# Patient Record
Sex: Female | Born: 1945
Health system: Southern US, Community
[De-identification: ages and names within clinical notes are randomized; demographics above are authoritative.]

## PROBLEM LIST (undated history)

## (undated) DIAGNOSIS — N950 Postmenopausal bleeding: Secondary | ICD-10-CM

## (undated) DIAGNOSIS — D649 Anemia, unspecified: Secondary | ICD-10-CM

## (undated) DIAGNOSIS — E785 Hyperlipidemia, unspecified: Secondary | ICD-10-CM

## (undated) DIAGNOSIS — M1990 Unspecified osteoarthritis, unspecified site: Secondary | ICD-10-CM

## (undated) DIAGNOSIS — E559 Vitamin D deficiency, unspecified: Secondary | ICD-10-CM

## (undated) DIAGNOSIS — R42 Dizziness and giddiness: Secondary | ICD-10-CM

## (undated) HISTORY — PX: SHOULDER ARTHROSCOPY: SHX128

## (undated) HISTORY — PX: COLONOSCOPY: SHX174

---

## 2004-06-27 ENCOUNTER — Other Ambulatory Visit: Payer: Self-pay

## 2005-06-07 ENCOUNTER — Ambulatory Visit: Payer: Self-pay | Admitting: Obstetrics and Gynecology

## 2005-08-09 ENCOUNTER — Encounter: Payer: Self-pay | Admitting: Internal Medicine

## 2005-09-01 ENCOUNTER — Encounter: Payer: Self-pay | Admitting: Internal Medicine

## 2006-06-13 ENCOUNTER — Ambulatory Visit: Payer: Self-pay | Admitting: Obstetrics and Gynecology

## 2007-06-18 ENCOUNTER — Ambulatory Visit: Payer: Self-pay | Admitting: Obstetrics and Gynecology

## 2008-06-24 ENCOUNTER — Ambulatory Visit: Payer: Self-pay | Admitting: Obstetrics and Gynecology

## 2009-06-30 ENCOUNTER — Ambulatory Visit: Payer: Self-pay | Admitting: Obstetrics and Gynecology

## 2009-07-07 ENCOUNTER — Ambulatory Visit: Payer: Self-pay

## 2009-08-20 ENCOUNTER — Ambulatory Visit: Payer: Self-pay | Admitting: Unknown Physician Specialty

## 2010-07-05 ENCOUNTER — Ambulatory Visit: Payer: Self-pay | Admitting: Obstetrics and Gynecology

## 2011-08-16 ENCOUNTER — Ambulatory Visit: Payer: Self-pay | Admitting: Obstetrics and Gynecology

## 2012-08-20 ENCOUNTER — Ambulatory Visit: Payer: Self-pay | Admitting: Obstetrics and Gynecology

## 2013-08-21 ENCOUNTER — Ambulatory Visit: Payer: Self-pay | Admitting: Obstetrics and Gynecology

## 2015-12-09 ENCOUNTER — Other Ambulatory Visit: Payer: Self-pay | Admitting: Internal Medicine

## 2015-12-09 DIAGNOSIS — E559 Vitamin D deficiency, unspecified: Secondary | ICD-10-CM | POA: Diagnosis not present

## 2015-12-09 DIAGNOSIS — M8580 Other specified disorders of bone density and structure, unspecified site: Secondary | ICD-10-CM | POA: Diagnosis not present

## 2015-12-09 DIAGNOSIS — N951 Menopausal and female climacteric states: Secondary | ICD-10-CM | POA: Diagnosis not present

## 2015-12-09 DIAGNOSIS — R55 Syncope and collapse: Secondary | ICD-10-CM | POA: Diagnosis not present

## 2015-12-09 DIAGNOSIS — Z1239 Encounter for other screening for malignant neoplasm of breast: Secondary | ICD-10-CM | POA: Diagnosis not present

## 2015-12-09 DIAGNOSIS — Z1231 Encounter for screening mammogram for malignant neoplasm of breast: Secondary | ICD-10-CM

## 2015-12-09 DIAGNOSIS — M533 Sacrococcygeal disorders, not elsewhere classified: Secondary | ICD-10-CM | POA: Diagnosis not present

## 2015-12-09 DIAGNOSIS — E784 Other hyperlipidemia: Secondary | ICD-10-CM | POA: Diagnosis not present

## 2015-12-09 DIAGNOSIS — Z1211 Encounter for screening for malignant neoplasm of colon: Secondary | ICD-10-CM | POA: Diagnosis not present

## 2015-12-09 DIAGNOSIS — Z1159 Encounter for screening for other viral diseases: Secondary | ICD-10-CM | POA: Diagnosis not present

## 2015-12-17 ENCOUNTER — Ambulatory Visit
Admission: RE | Admit: 2015-12-17 | Discharge: 2015-12-17 | Disposition: A | Discharge status: Home / Self Care | Payer: PPO | Source: Ambulatory Visit | Attending: Internal Medicine | Admitting: Internal Medicine

## 2015-12-17 DIAGNOSIS — R55 Syncope and collapse: Secondary | ICD-10-CM | POA: Diagnosis not present

## 2015-12-17 DIAGNOSIS — Z1231 Encounter for screening mammogram for malignant neoplasm of breast: Secondary | ICD-10-CM | POA: Diagnosis not present

## 2015-12-20 DIAGNOSIS — R55 Syncope and collapse: Secondary | ICD-10-CM | POA: Diagnosis not present

## 2015-12-20 DIAGNOSIS — R42 Dizziness and giddiness: Secondary | ICD-10-CM | POA: Diagnosis not present

## 2016-01-03 DIAGNOSIS — Z1159 Encounter for screening for other viral diseases: Secondary | ICD-10-CM | POA: Diagnosis not present

## 2016-01-10 DIAGNOSIS — E559 Vitamin D deficiency, unspecified: Secondary | ICD-10-CM | POA: Diagnosis not present

## 2016-01-10 DIAGNOSIS — E784 Other hyperlipidemia: Secondary | ICD-10-CM | POA: Diagnosis not present

## 2016-01-10 DIAGNOSIS — M533 Sacrococcygeal disorders, not elsewhere classified: Secondary | ICD-10-CM | POA: Diagnosis not present

## 2016-01-10 DIAGNOSIS — R55 Syncope and collapse: Secondary | ICD-10-CM | POA: Diagnosis not present

## 2016-01-10 DIAGNOSIS — Z23 Encounter for immunization: Secondary | ICD-10-CM | POA: Diagnosis not present

## 2016-01-12 DIAGNOSIS — N951 Menopausal and female climacteric states: Secondary | ICD-10-CM | POA: Diagnosis not present

## 2016-03-14 DIAGNOSIS — Z79890 Hormone replacement therapy (postmenopausal): Secondary | ICD-10-CM | POA: Diagnosis not present

## 2016-03-14 DIAGNOSIS — R55 Syncope and collapse: Secondary | ICD-10-CM | POA: Diagnosis not present

## 2016-03-21 DIAGNOSIS — L988 Other specified disorders of the skin and subcutaneous tissue: Secondary | ICD-10-CM | POA: Diagnosis not present

## 2016-03-21 DIAGNOSIS — L919 Hypertrophic disorder of the skin, unspecified: Secondary | ICD-10-CM | POA: Diagnosis not present

## 2016-03-21 DIAGNOSIS — N649 Disorder of breast, unspecified: Secondary | ICD-10-CM | POA: Diagnosis not present

## 2016-10-09 DIAGNOSIS — J019 Acute sinusitis, unspecified: Secondary | ICD-10-CM | POA: Diagnosis not present

## 2016-12-04 DIAGNOSIS — R55 Syncope and collapse: Secondary | ICD-10-CM | POA: Diagnosis not present

## 2016-12-04 DIAGNOSIS — E559 Vitamin D deficiency, unspecified: Secondary | ICD-10-CM | POA: Diagnosis not present

## 2016-12-04 DIAGNOSIS — E784 Other hyperlipidemia: Secondary | ICD-10-CM | POA: Diagnosis not present

## 2016-12-04 DIAGNOSIS — M533 Sacrococcygeal disorders, not elsewhere classified: Secondary | ICD-10-CM | POA: Diagnosis not present

## 2017-01-15 ENCOUNTER — Other Ambulatory Visit: Payer: Self-pay | Admitting: Obstetrics and Gynecology

## 2017-01-15 DIAGNOSIS — Z124 Encounter for screening for malignant neoplasm of cervix: Secondary | ICD-10-CM | POA: Diagnosis not present

## 2017-01-15 DIAGNOSIS — N950 Postmenopausal bleeding: Secondary | ICD-10-CM | POA: Diagnosis not present

## 2017-01-15 DIAGNOSIS — Z1231 Encounter for screening mammogram for malignant neoplasm of breast: Secondary | ICD-10-CM

## 2017-01-15 DIAGNOSIS — Z01419 Encounter for gynecological examination (general) (routine) without abnormal findings: Secondary | ICD-10-CM | POA: Diagnosis not present

## 2017-01-26 DIAGNOSIS — N950 Postmenopausal bleeding: Secondary | ICD-10-CM | POA: Diagnosis not present

## 2017-02-08 ENCOUNTER — Ambulatory Visit
Admission: RE | Admit: 2017-02-08 | Discharge: 2017-02-08 | Disposition: A | Discharge status: Home / Self Care | Payer: PPO | Source: Ambulatory Visit | Attending: Obstetrics and Gynecology | Admitting: Obstetrics and Gynecology

## 2017-02-08 DIAGNOSIS — Z1231 Encounter for screening mammogram for malignant neoplasm of breast: Secondary | ICD-10-CM

## 2017-03-01 DIAGNOSIS — E559 Vitamin D deficiency, unspecified: Secondary | ICD-10-CM | POA: Diagnosis not present

## 2017-03-01 DIAGNOSIS — Z0000 Encounter for general adult medical examination without abnormal findings: Secondary | ICD-10-CM | POA: Diagnosis not present

## 2017-03-01 DIAGNOSIS — Z79890 Hormone replacement therapy (postmenopausal): Secondary | ICD-10-CM | POA: Diagnosis not present

## 2017-03-01 DIAGNOSIS — E784 Other hyperlipidemia: Secondary | ICD-10-CM | POA: Diagnosis not present

## 2017-03-01 DIAGNOSIS — M8589 Other specified disorders of bone density and structure, multiple sites: Secondary | ICD-10-CM | POA: Diagnosis not present

## 2018-01-17 DIAGNOSIS — Z124 Encounter for screening for malignant neoplasm of cervix: Secondary | ICD-10-CM | POA: Diagnosis not present

## 2018-01-17 DIAGNOSIS — N393 Stress incontinence (female) (male): Secondary | ICD-10-CM | POA: Diagnosis not present

## 2018-01-17 DIAGNOSIS — Z1231 Encounter for screening mammogram for malignant neoplasm of breast: Secondary | ICD-10-CM | POA: Diagnosis not present

## 2018-01-21 ENCOUNTER — Other Ambulatory Visit: Payer: Self-pay | Admitting: Obstetrics and Gynecology

## 2018-01-21 DIAGNOSIS — Z1231 Encounter for screening mammogram for malignant neoplasm of breast: Secondary | ICD-10-CM

## 2018-02-14 ENCOUNTER — Ambulatory Visit
Admission: RE | Admit: 2018-02-14 | Discharge: 2018-02-14 | Disposition: A | Discharge status: Home / Self Care | Payer: PPO | Source: Ambulatory Visit | Attending: Obstetrics and Gynecology | Admitting: Obstetrics and Gynecology

## 2018-02-14 DIAGNOSIS — Z1231 Encounter for screening mammogram for malignant neoplasm of breast: Secondary | ICD-10-CM | POA: Diagnosis not present

## 2018-02-26 DIAGNOSIS — E559 Vitamin D deficiency, unspecified: Secondary | ICD-10-CM | POA: Diagnosis not present

## 2018-02-26 DIAGNOSIS — Z79890 Hormone replacement therapy (postmenopausal): Secondary | ICD-10-CM | POA: Diagnosis not present

## 2018-02-26 DIAGNOSIS — E7849 Other hyperlipidemia: Secondary | ICD-10-CM | POA: Diagnosis not present

## 2018-03-04 DIAGNOSIS — M8589 Other specified disorders of bone density and structure, multiple sites: Secondary | ICD-10-CM | POA: Diagnosis not present

## 2018-03-04 DIAGNOSIS — Z780 Asymptomatic menopausal state: Secondary | ICD-10-CM | POA: Diagnosis not present

## 2018-03-04 DIAGNOSIS — D72829 Elevated white blood cell count, unspecified: Secondary | ICD-10-CM | POA: Diagnosis not present

## 2018-03-04 DIAGNOSIS — E7849 Other hyperlipidemia: Secondary | ICD-10-CM | POA: Diagnosis not present

## 2018-03-04 DIAGNOSIS — E559 Vitamin D deficiency, unspecified: Secondary | ICD-10-CM | POA: Diagnosis not present

## 2018-03-04 DIAGNOSIS — M533 Sacrococcygeal disorders, not elsewhere classified: Secondary | ICD-10-CM | POA: Diagnosis not present

## 2018-03-04 DIAGNOSIS — Z0000 Encounter for general adult medical examination without abnormal findings: Secondary | ICD-10-CM | POA: Diagnosis not present

## 2018-03-15 DIAGNOSIS — Z780 Asymptomatic menopausal state: Secondary | ICD-10-CM | POA: Diagnosis not present

## 2018-03-15 DIAGNOSIS — E559 Vitamin D deficiency, unspecified: Secondary | ICD-10-CM | POA: Diagnosis not present

## 2018-08-12 DIAGNOSIS — M7062 Trochanteric bursitis, left hip: Secondary | ICD-10-CM | POA: Diagnosis not present

## 2018-08-12 DIAGNOSIS — M4306 Spondylolysis, lumbar region: Secondary | ICD-10-CM | POA: Diagnosis not present

## 2019-08-07 ENCOUNTER — Other Ambulatory Visit: Payer: Self-pay | Admitting: Obstetrics and Gynecology

## 2019-08-07 DIAGNOSIS — Z01419 Encounter for gynecological examination (general) (routine) without abnormal findings: Secondary | ICD-10-CM | POA: Diagnosis not present

## 2019-08-07 DIAGNOSIS — N950 Postmenopausal bleeding: Secondary | ICD-10-CM | POA: Diagnosis not present

## 2019-08-07 DIAGNOSIS — Z1231 Encounter for screening mammogram for malignant neoplasm of breast: Secondary | ICD-10-CM | POA: Diagnosis not present

## 2019-08-07 DIAGNOSIS — Z124 Encounter for screening for malignant neoplasm of cervix: Secondary | ICD-10-CM | POA: Diagnosis not present

## 2019-09-04 DIAGNOSIS — N950 Postmenopausal bleeding: Secondary | ICD-10-CM | POA: Diagnosis not present

## 2019-09-04 DIAGNOSIS — D259 Leiomyoma of uterus, unspecified: Secondary | ICD-10-CM | POA: Diagnosis not present

## 2019-11-19 DIAGNOSIS — N882 Stricture and stenosis of cervix uteri: Secondary | ICD-10-CM | POA: Diagnosis not present

## 2019-11-19 DIAGNOSIS — N950 Postmenopausal bleeding: Secondary | ICD-10-CM | POA: Diagnosis not present

## 2019-11-19 DIAGNOSIS — D259 Leiomyoma of uterus, unspecified: Secondary | ICD-10-CM | POA: Diagnosis not present

## 2020-01-19 ENCOUNTER — Ambulatory Visit
Admission: RE | Admit: 2020-01-19 | Discharge: 2020-01-19 | Disposition: A | Discharge status: Home / Self Care | Payer: PPO | Source: Ambulatory Visit | Attending: Obstetrics and Gynecology | Admitting: Obstetrics and Gynecology

## 2020-01-19 DIAGNOSIS — Z1231 Encounter for screening mammogram for malignant neoplasm of breast: Secondary | ICD-10-CM | POA: Diagnosis not present

## 2020-04-07 DIAGNOSIS — R768 Other specified abnormal immunological findings in serum: Secondary | ICD-10-CM | POA: Diagnosis not present

## 2020-04-07 DIAGNOSIS — E7849 Other hyperlipidemia: Secondary | ICD-10-CM | POA: Diagnosis not present

## 2020-04-07 DIAGNOSIS — E559 Vitamin D deficiency, unspecified: Secondary | ICD-10-CM | POA: Diagnosis not present

## 2020-04-08 DIAGNOSIS — R768 Other specified abnormal immunological findings in serum: Secondary | ICD-10-CM | POA: Diagnosis not present

## 2020-04-08 DIAGNOSIS — E7849 Other hyperlipidemia: Secondary | ICD-10-CM | POA: Diagnosis not present

## 2020-04-12 DIAGNOSIS — Z1211 Encounter for screening for malignant neoplasm of colon: Secondary | ICD-10-CM | POA: Diagnosis not present

## 2020-04-12 DIAGNOSIS — R3129 Other microscopic hematuria: Secondary | ICD-10-CM | POA: Diagnosis not present

## 2020-04-12 DIAGNOSIS — Z0000 Encounter for general adult medical examination without abnormal findings: Secondary | ICD-10-CM | POA: Diagnosis not present

## 2020-04-12 DIAGNOSIS — E7849 Other hyperlipidemia: Secondary | ICD-10-CM | POA: Diagnosis not present

## 2020-04-12 DIAGNOSIS — E559 Vitamin D deficiency, unspecified: Secondary | ICD-10-CM | POA: Diagnosis not present

## 2020-06-19 IMAGING — MG DIGITAL SCREENING BILAT W/ TOMO W/ CAD
6 of 10 series · 6 of 30 positions shown · non-contrast
Comparison: Previous exam(s).

CLINICAL DATA: Screening.

EXAM:
DIGITAL SCREENING BILATERAL MAMMOGRAM WITH TOMO AND CAD

[L MLO synth-2D (1 of 2)]
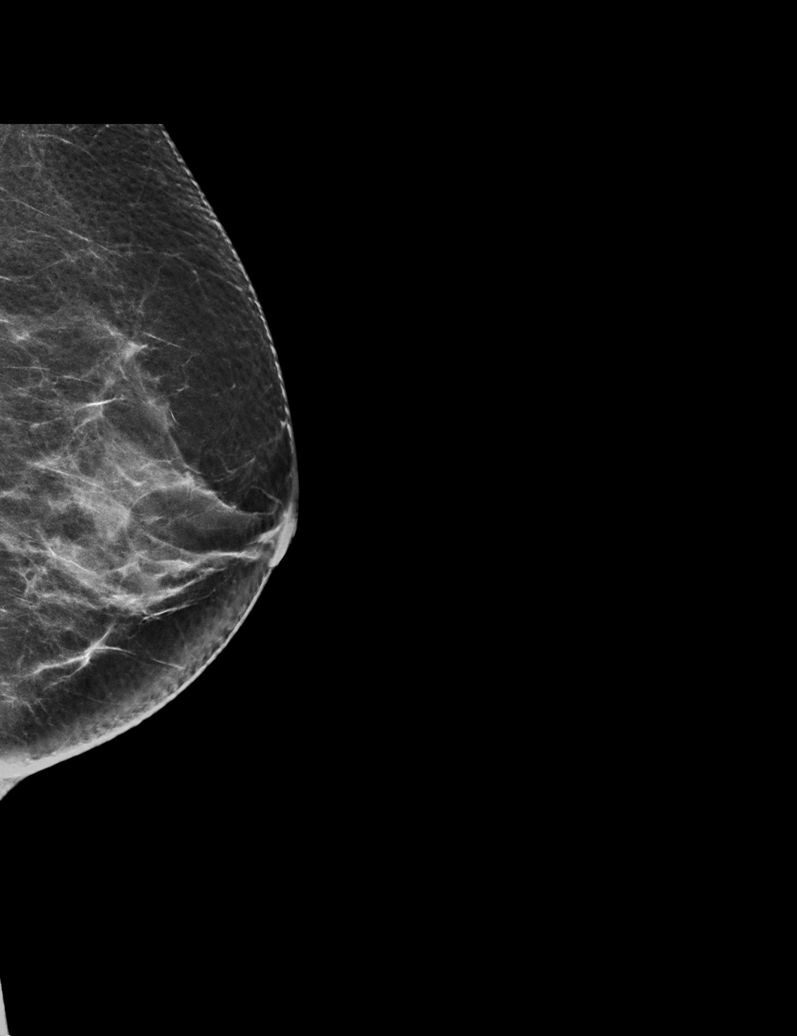

[L CC synth-2D]
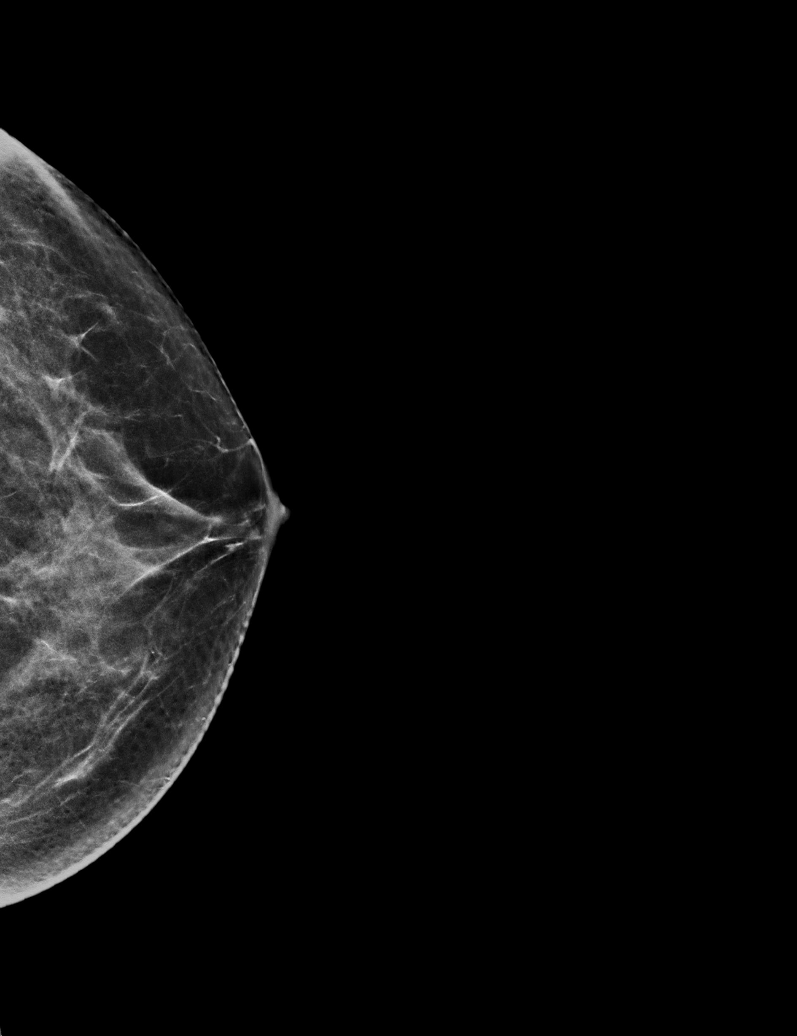

[L MLO synth-2D (2 of 2)]
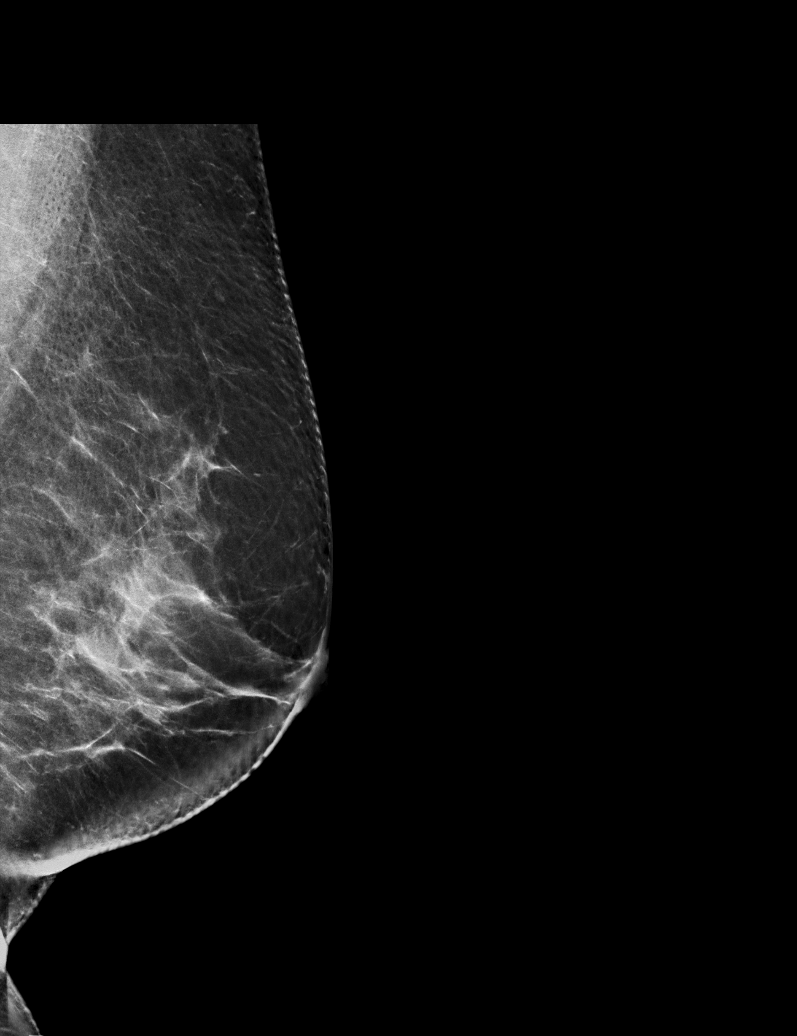

[R MLO synth-2D]
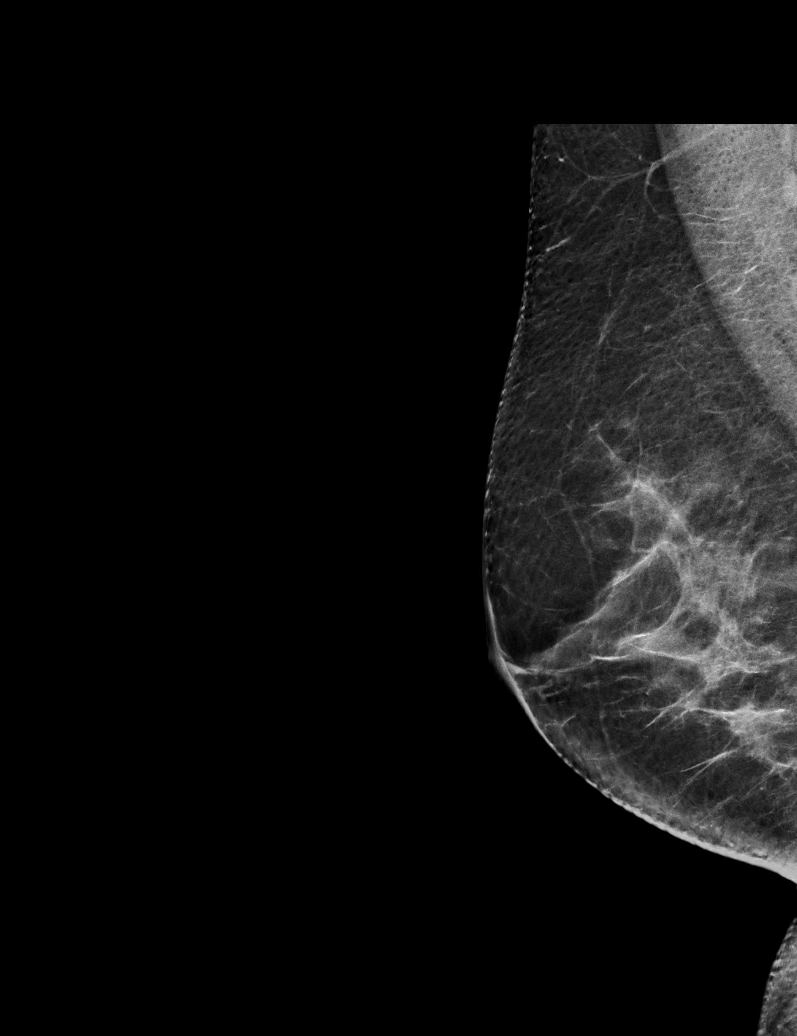

[R CC synth-2D]
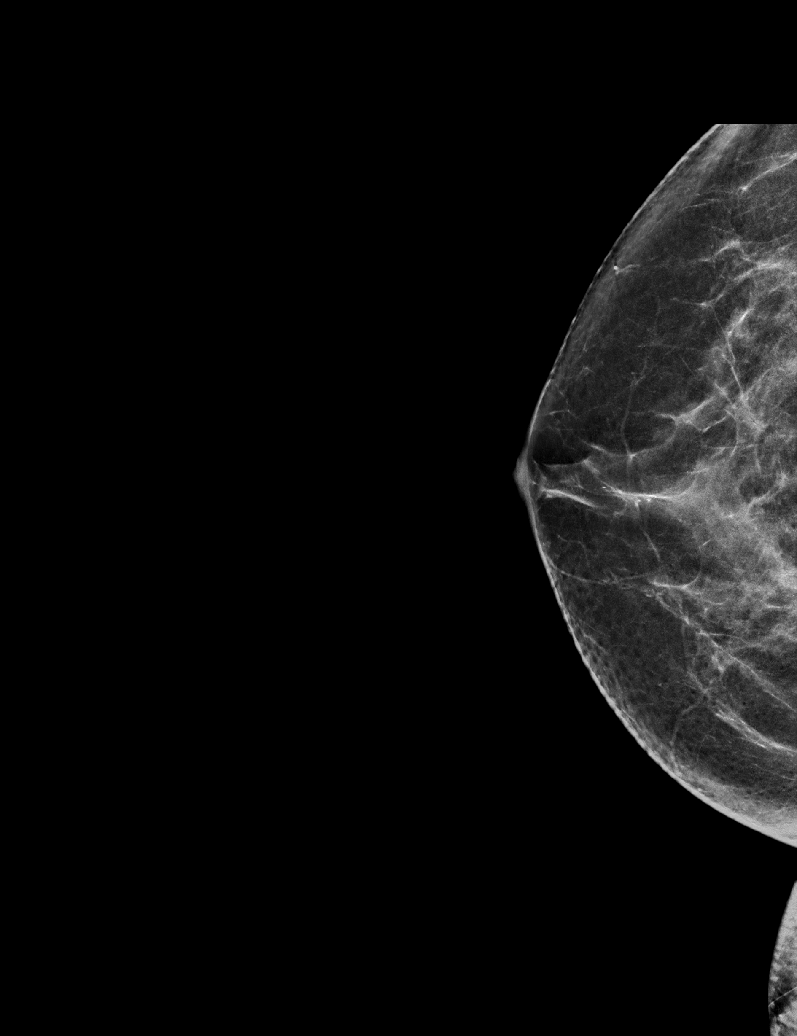

[R MLO tomo · tomo slice 33/66.0]
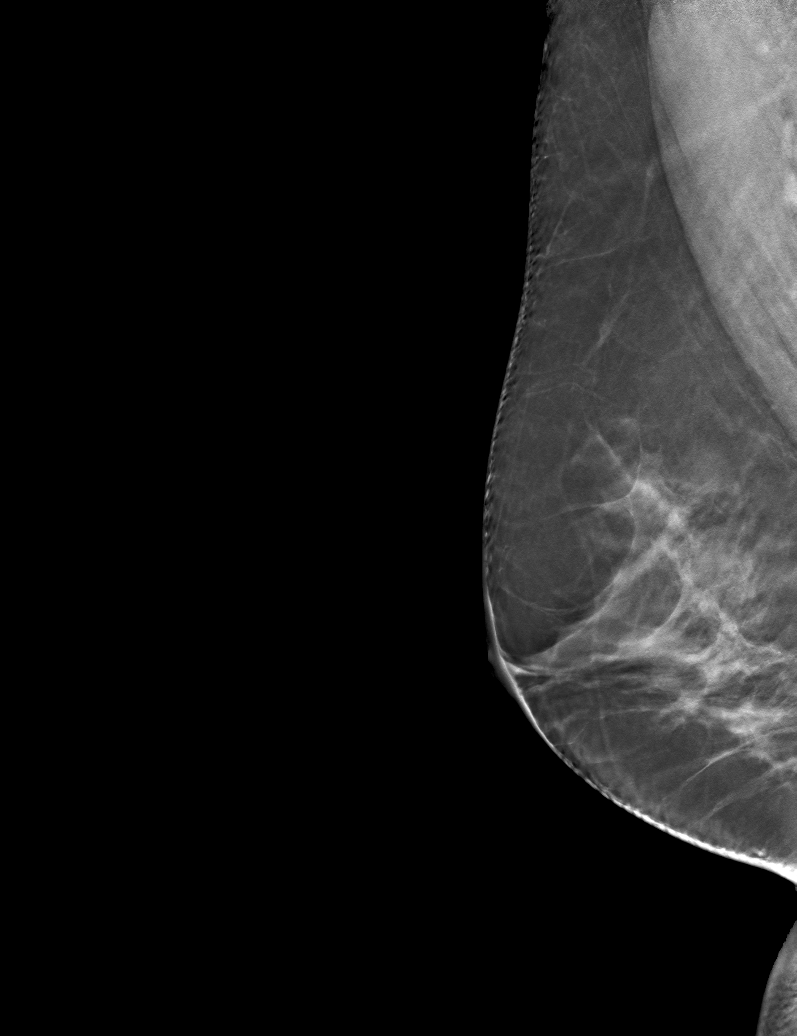

[6 of 30 positions shown; findings below may reference images not displayed]

ACR Breast Density Category c: The breast tissue is heterogeneously
dense, which may obscure small masses.
FINDINGS: There are no findings suspicious for malignancy. Images were
processed with CAD.
IMPRESSION: No mammographic evidence of malignancy. A result letter of this
screening mammogram will be mailed directly to the patient.

RECOMMENDATION:
Screening mammogram in one year. (Code:[id number])

BI-RADS CATEGORY  1: Negative.

## 2020-06-23 DIAGNOSIS — Z1211 Encounter for screening for malignant neoplasm of colon: Secondary | ICD-10-CM | POA: Diagnosis not present

## 2020-06-23 DIAGNOSIS — R768 Other specified abnormal immunological findings in serum: Secondary | ICD-10-CM | POA: Diagnosis not present

## 2020-06-23 DIAGNOSIS — Z01812 Encounter for preprocedural laboratory examination: Secondary | ICD-10-CM | POA: Diagnosis not present

## 2020-07-13 DIAGNOSIS — E7849 Other hyperlipidemia: Secondary | ICD-10-CM | POA: Diagnosis not present

## 2020-07-13 DIAGNOSIS — E559 Vitamin D deficiency, unspecified: Secondary | ICD-10-CM | POA: Diagnosis not present

## 2020-07-30 DIAGNOSIS — Z01818 Encounter for other preprocedural examination: Secondary | ICD-10-CM | POA: Diagnosis not present

## 2020-08-03 DIAGNOSIS — Z1211 Encounter for screening for malignant neoplasm of colon: Secondary | ICD-10-CM | POA: Diagnosis not present

## 2020-08-03 DIAGNOSIS — Z86010 Personal history of colonic polyps: Secondary | ICD-10-CM | POA: Diagnosis not present

## 2020-08-03 DIAGNOSIS — K6389 Other specified diseases of intestine: Secondary | ICD-10-CM | POA: Diagnosis not present

## 2020-08-03 DIAGNOSIS — K635 Polyp of colon: Secondary | ICD-10-CM | POA: Diagnosis not present

## 2020-08-03 DIAGNOSIS — K640 First degree hemorrhoids: Secondary | ICD-10-CM | POA: Diagnosis not present

## 2021-04-06 DIAGNOSIS — E559 Vitamin D deficiency, unspecified: Secondary | ICD-10-CM | POA: Diagnosis not present

## 2021-04-06 DIAGNOSIS — S80262A Insect bite (nonvenomous), left knee, initial encounter: Secondary | ICD-10-CM | POA: Diagnosis not present

## 2021-04-06 DIAGNOSIS — S80261A Insect bite (nonvenomous), right knee, initial encounter: Secondary | ICD-10-CM | POA: Diagnosis not present

## 2021-04-06 DIAGNOSIS — S70362A Insect bite (nonvenomous), left thigh, initial encounter: Secondary | ICD-10-CM | POA: Diagnosis not present

## 2021-04-06 DIAGNOSIS — R3129 Other microscopic hematuria: Secondary | ICD-10-CM | POA: Diagnosis not present

## 2021-04-06 DIAGNOSIS — E7849 Other hyperlipidemia: Secondary | ICD-10-CM | POA: Diagnosis not present

## 2021-04-06 DIAGNOSIS — S70361A Insect bite (nonvenomous), right thigh, initial encounter: Secondary | ICD-10-CM | POA: Diagnosis not present

## 2021-04-13 ENCOUNTER — Other Ambulatory Visit: Payer: Self-pay | Admitting: Internal Medicine

## 2021-04-13 ENCOUNTER — Other Ambulatory Visit: Payer: Self-pay | Admitting: Obstetrics and Gynecology

## 2021-04-13 DIAGNOSIS — Z1231 Encounter for screening mammogram for malignant neoplasm of breast: Secondary | ICD-10-CM

## 2021-04-13 DIAGNOSIS — Z780 Asymptomatic menopausal state: Secondary | ICD-10-CM | POA: Diagnosis not present

## 2021-04-13 DIAGNOSIS — D72829 Elevated white blood cell count, unspecified: Secondary | ICD-10-CM | POA: Diagnosis not present

## 2021-04-13 DIAGNOSIS — E039 Hypothyroidism, unspecified: Secondary | ICD-10-CM | POA: Diagnosis not present

## 2021-04-13 DIAGNOSIS — Z0000 Encounter for general adult medical examination without abnormal findings: Secondary | ICD-10-CM | POA: Diagnosis not present

## 2021-04-13 DIAGNOSIS — E559 Vitamin D deficiency, unspecified: Secondary | ICD-10-CM | POA: Diagnosis not present

## 2021-04-13 DIAGNOSIS — R42 Dizziness and giddiness: Secondary | ICD-10-CM | POA: Diagnosis not present

## 2021-04-13 DIAGNOSIS — E7849 Other hyperlipidemia: Secondary | ICD-10-CM | POA: Diagnosis not present

## 2021-04-19 DIAGNOSIS — M8588 Other specified disorders of bone density and structure, other site: Secondary | ICD-10-CM | POA: Diagnosis not present

## 2021-04-22 ENCOUNTER — Ambulatory Visit
Admission: RE | Admit: 2021-04-22 | Discharge: 2021-04-22 | Disposition: A | Discharge status: Home / Self Care | Payer: PPO | Source: Ambulatory Visit | Attending: Internal Medicine | Admitting: Internal Medicine

## 2021-04-22 ENCOUNTER — Other Ambulatory Visit: Payer: Self-pay

## 2021-04-22 DIAGNOSIS — Z1231 Encounter for screening mammogram for malignant neoplasm of breast: Secondary | ICD-10-CM

## 2021-05-24 DIAGNOSIS — D72829 Elevated white blood cell count, unspecified: Secondary | ICD-10-CM | POA: Diagnosis not present

## 2021-05-24 DIAGNOSIS — E039 Hypothyroidism, unspecified: Secondary | ICD-10-CM | POA: Diagnosis not present

## 2022-01-04 DIAGNOSIS — H35363 Drusen (degenerative) of macula, bilateral: Secondary | ICD-10-CM | POA: Diagnosis not present

## 2022-04-25 DIAGNOSIS — E7849 Other hyperlipidemia: Secondary | ICD-10-CM | POA: Diagnosis not present

## 2022-04-25 DIAGNOSIS — Z0000 Encounter for general adult medical examination without abnormal findings: Secondary | ICD-10-CM | POA: Diagnosis not present

## 2022-04-25 DIAGNOSIS — D72829 Elevated white blood cell count, unspecified: Secondary | ICD-10-CM | POA: Diagnosis not present

## 2022-04-25 DIAGNOSIS — E559 Vitamin D deficiency, unspecified: Secondary | ICD-10-CM | POA: Diagnosis not present

## 2022-05-02 DIAGNOSIS — E559 Vitamin D deficiency, unspecified: Secondary | ICD-10-CM | POA: Diagnosis not present

## 2022-05-02 DIAGNOSIS — E7849 Other hyperlipidemia: Secondary | ICD-10-CM | POA: Diagnosis not present

## 2022-05-02 DIAGNOSIS — Z0000 Encounter for general adult medical examination without abnormal findings: Secondary | ICD-10-CM | POA: Diagnosis not present

## 2022-05-02 DIAGNOSIS — N950 Postmenopausal bleeding: Secondary | ICD-10-CM | POA: Diagnosis not present

## 2022-05-25 DIAGNOSIS — N950 Postmenopausal bleeding: Secondary | ICD-10-CM | POA: Diagnosis not present

## 2022-05-29 DIAGNOSIS — N950 Postmenopausal bleeding: Secondary | ICD-10-CM | POA: Diagnosis not present

## 2022-05-29 DIAGNOSIS — Z124 Encounter for screening for malignant neoplasm of cervix: Secondary | ICD-10-CM | POA: Diagnosis not present

## 2022-05-29 DIAGNOSIS — Z1331 Encounter for screening for depression: Secondary | ICD-10-CM | POA: Diagnosis not present

## 2022-05-29 DIAGNOSIS — Z01419 Encounter for gynecological examination (general) (routine) without abnormal findings: Secondary | ICD-10-CM | POA: Diagnosis not present

## 2022-05-29 DIAGNOSIS — Z1231 Encounter for screening mammogram for malignant neoplasm of breast: Secondary | ICD-10-CM | POA: Diagnosis not present

## 2022-05-30 ENCOUNTER — Other Ambulatory Visit: Payer: Self-pay | Admitting: Internal Medicine

## 2022-05-30 ENCOUNTER — Other Ambulatory Visit: Payer: Self-pay | Admitting: Obstetrics and Gynecology

## 2022-05-30 DIAGNOSIS — Z1231 Encounter for screening mammogram for malignant neoplasm of breast: Secondary | ICD-10-CM

## 2022-06-01 ENCOUNTER — Ambulatory Visit
Admission: RE | Admit: 2022-06-01 | Discharge: 2022-06-01 | Disposition: A | Discharge status: Home / Self Care | Payer: PPO | Source: Ambulatory Visit | Attending: Obstetrics and Gynecology | Admitting: Obstetrics and Gynecology

## 2022-06-01 DIAGNOSIS — Z1231 Encounter for screening mammogram for malignant neoplasm of breast: Secondary | ICD-10-CM | POA: Insufficient documentation

## 2022-06-27 DIAGNOSIS — N950 Postmenopausal bleeding: Secondary | ICD-10-CM | POA: Diagnosis not present

## 2022-06-27 DIAGNOSIS — N882 Stricture and stenosis of cervix uteri: Secondary | ICD-10-CM | POA: Diagnosis not present

## 2022-07-12 DIAGNOSIS — N950 Postmenopausal bleeding: Secondary | ICD-10-CM | POA: Diagnosis not present

## 2022-07-18 NOTE — H&P (Signed)
Chief Complaint:   Kimberly Kim is a 76 y.o. female here for Pre Op Consulting (Sign consents) . History of Present Illness: Patient presents for a preoperative visit to schedule a D&C, hysteroscopy, and myomectomy.    Workup has included:   TVUS 05/2022 for PMB Uterus=8.58 x 5.41 x 8.50cm Uterus retroverted Endometrium=4.66m Hypoechoic complex area seen in cervix=3.34 x 1.46 x 2.07cm Rt ovary appears wnl Left ovary appears wnl No free fluid seen Fibroids seen:1)Lt lateral cervix=4.4cm     2)posterior=1.8cm    3)Lt mid=1.9cm    4)Lt lateral anterior=1.8cm    5)Lt lateral=1.6cm   SIS 06/2022 for PMB - Uterus: retroverted 9.02 x 4.92 x 5.43 cm  - Endometrium: 4.72 mm  - Hypoechoic complex area in cervix 3.31 x 1.48 x 2.09 cm  - Ovaries: RO wnl, LO 0.88 cm cyst    EMBx 06/2022  NO ENDOMETRIAL TISSUE IDENTIFIED. MINUTE FRAGMENTS OF BENIGN ENDOCERVICAL GLANDS, MUCUS, AND BLOOD.    Pertinent hx:   -On HRT, restarted at age [age]for life-limiting vasomotor sx; will readdress each year---> "Would rather take a bullet to the head than stop my estrogen."   -PMB 2017; ES=3.761m spotting for about 3 days very occasionally for the past few years, now more continuous  -PMB 2021, endosee with bx done 11/2019. Plan for repeat bx and endosee in 6 months. Pt didn't return  -Stress urinary incontinence; she chose to start kegels at home for the time being   Past Medical History:  has a past medical history of Bleeding disorder (CMS-HCC) (03/02/2022), Hyperlipidemia (12/08/2015), Osteopenia (12/08/2015), Sacroiliac pain (12/08/2015), and Vitamin D deficiency (12/08/2015).  Past Surgical History:  has a past surgical history that includes Arthroscopic Rotator Cuff Repair (Right) and Colonoscopy (08/03/2020). Family History: family history includes Osteoporosis (Thinning of bones) in her mother. Social History:  reports that she has never smoked. She has never used smokeless tobacco. She reports  that she does not currently use alcohol. She reports that she does not use drugs. OB/GYN History:   OB History        Gravida 2   Para 2   Term 2   Preterm     AB     Living 2        SAB     IAB     Ectopic     Molar     Multiple     Live Births             Allergies: has No Known Allergies. Medications:   Current Outpatient Medications:    cholecalciferol (VITAMIN D3) 2,000 unit tablet, Take 2 Units by mouth once daily, Disp: , Rfl:    estradioL (ESTRACE) 2 MG tablet, Take 1 tablet by mouth once daily, Disp: 90 tablet, Rfl: 0   medroxyPROGESTERone (PROVERA) 10 MG tablet, Take 1/2 (one-half) tablet by mouth once daily, Disp: 45 tablet, Rfl: 0   loratadine (CLARITIN ORAL), Take by mouth As needed (Patient not taking: Reported on 05/29/2022), Disp: , Rfl:    Review of Systems: No SOB, no palpitations or chest pain, no new lower extremity edema, no nausea or vomiting or bowel or bladder complaints. See HPI for gyn specific ROS.     Exam:   BP 130/72   Pulse 74   Ht 157.5 cm ('5\' 2"'$ )   Wt 62.8 kg (138 lb 6.4 oz)   BMI 25.31 kg/m    General: Patient is well-groomed, well-nourished, appears stated age in no  acute distress   HEENT: head is atraumatic and normocephalic, trachea is midline, neck is supple with no palpable nodules   CV: Regular rhythm and normal heart rate, no murmur   Pulm: Clear to auscultation throughout lung fields with no wheezing, crackles, or rhonchi. No increased work of breathing   Abdomen: soft , no mass, non-tender, no rebound tenderness, no hepatomegaly   Pelvic: tanner stage 5 ,              External genitalia: vulva /labia no lesions             Urethra: no prolapse             Vagina: normal physiologic d/c, laxity in vaginal walls             Cervix: no lesions, no cervical motion tenderness, good descent             Uterus: normal size shape and contour, non-tender             Adnexa: no mass,  non-tender                Rectovaginal: External wnl   A female chaperone was present for the more sensitive portions of the physical exam ( such as breast and pelvic)    Impression:   The encounter diagnosis was Post-menopausal bleeding.    Plan:   1.  Preoperative visit: D&C hysteroscopy with myomectomy. Consents signed today.  -Risks of surgery were discussed with the patient including but not limited to: bleeding which may require transfusion; infection which may require antibiotics; injury to uterus or surrounding organs; intrauterine scarring which may impair future fertility; need for additional procedures including laparotomy or laparoscopy; and other postoperative/anesthesia complications. Written informed consent was obtained.   This is a scheduled same-day surgery. She will have a postop visit in 2 weeks to review operative findings and pathology.   Return for Postop check.

## 2022-07-19 ENCOUNTER — Encounter
Admission: RE | Admit: 2022-07-19 | Discharge: 2022-07-19 | Disposition: A | Discharge status: Home / Self Care | Payer: PPO | Source: Ambulatory Visit | Attending: Obstetrics and Gynecology | Admitting: Obstetrics and Gynecology

## 2022-07-19 DIAGNOSIS — Z01818 Encounter for other preprocedural examination: Secondary | ICD-10-CM

## 2022-07-19 DIAGNOSIS — E785 Hyperlipidemia, unspecified: Secondary | ICD-10-CM

## 2022-07-19 HISTORY — DX: Vitamin D deficiency, unspecified: E55.9

## 2022-07-19 HISTORY — DX: Anemia, unspecified: D64.9

## 2022-07-19 HISTORY — DX: Hyperlipidemia, unspecified: E78.5

## 2022-07-19 NOTE — Patient Instructions (Signed)
Your procedure is scheduled on:07-27-22 Thursday Report to the Registration Desk on the 1st floor of the [facility].Then proceed to the 2nd floor Surgery Desk To find out your arrival time, please call [phone] between 1PM - 3PM on:07-26-22 Wednesday If your arrival time is 6:00 am, do not arrive prior to that time as the [facility] entrance doors do not open until 6:00 am.  REMEMBER: Instructions that are not followed completely may result in serious medical risk, up to and including death; or upon the discretion of your surgeon and anesthesiologist your surgery may need to be rescheduled.  Do not eat food after midnight the night before surgery.  No gum chewing, lozengers or hard candies.  You may however, drink CLEAR liquids up to 2 hours before you are scheduled to arrive for your surgery. Do not drink anything within 2 hours of your scheduled arrival time.  Clear liquids include: - water  - apple juice without pulp - gatorade (not RED colors) - black coffee or tea (Do NOT add milk or creamers to the coffee or tea) Do NOT drink anything that is not on this list.  Do NOT take any medication the day of surgery  One week prior to surgery: Stop Anti-inflammatories (NSAIDS) such as Advil, Aleve, Ibuprofen, Motrin, Naproxen, Naprosyn and Aspirin based products such as Excedrin, Goodys Powder, BC Powder.You may however, continue to take Tylenol if needed for pain up until the day of surgery.  Stop ANY OVER THE COUNTER supplements/vitamins NOW (07-19-22) until after surgery (Vitamin D3)  No Alcohol for 24 hours before or after surgery.  No Smoking including e-cigarettes for 24 hours prior to surgery.  No chewable tobacco products for at least 6 hours prior to surgery.  No nicotine patches on the day of surgery.  Do not use any "recreational" drugs for at least a week prior to your surgery.  Please be advised that the combination of cocaine and anesthesia may have negative  outcomes, up to and including death. If you test positive for cocaine, your surgery will be cancelled.  On the morning of surgery brush your teeth with toothpaste and water, you may rinse your mouth with mouthwash if you wish. Do not swallow any toothpaste or mouthwash.  Do not wear jewelry, make-up, hairpins, clips or nail polish.  Do not wear lotions, powders, or perfumes.   Do not shave body from the neck down 48 hours prior to surgery just in case you cut yourself which could leave a site for infection.  Also, freshly shaved skin may become irritated if using the CHG soap.  Contact lenses, hearing aids and dentures may not be worn into surgery.  Do not bring valuables to the hospital. [facility] is not responsible for any missing/lost belongings or valuables.   Notify your doctor if there is any change in your medical condition (cold, fever, infection).  Wear comfortable clothing (specific to your surgery type) to the hospital.  After surgery, you can help prevent lung complications by doing breathing exercises.  Take deep breaths and cough every 1-2 hours. Your doctor may order a device called an Incentive Spirometer to help you take deep breaths. When coughing or sneezing, hold a pillow firmly against your incision with both hands. This is called "splinting." Doing this helps protect your incision. It also decreases belly discomfort.  If you are being admitted to the hospital overnight, leave your suitcase in the car. After surgery it may be brought to your room.  If you are being discharged the day of surgery, you will not be allowed to drive home. You will need a responsible adult (18 years or older) to drive you home and stay with you that night.   If you are taking public transportation, you will need to have a responsible adult (18 years or older) with you. Please confirm with your physician that it is acceptable to use public transportation.   Please call the  [facility] at [phone] if you have any questions about these instructions.  Surgery Visitation Policy:  Patients undergoing a surgery or procedure may have two family members or support persons with them as long as the person is not COVID-19 positive or experiencing its symptoms.     How to Use an Incentive Spirometer An incentive spirometer is a tool that measures how well you are filling your lungs with each breath. Learning to take long, deep breaths using this tool can help you keep your lungs clear and active. This may help to reverse or lessen your chance of developing breathing (pulmonary) problems, especially infection. You may be asked to use a spirometer: After a surgery. If you have a lung problem or a history of smoking. After a long period of time when you have been unable to move or be active. If the spirometer includes an indicator to show the highest number that you have reached, your health care provider or respiratory therapist will help you set a goal. Keep a log of your progress as told by your health care provider. What are the risks? Breathing too quickly may cause dizziness or cause you to pass out. Take your time so you do not get dizzy or light-headed. If you are in pain, you may need to take pain medicine before doing incentive spirometry. It is harder to take a deep breath if you are having pain. How to use your incentive spirometer  Sit up on the edge of your bed or on a chair. Hold the incentive spirometer so that it is in an upright position. Before you use the spirometer, breathe out normally. Place the mouthpiece in your mouth. Make sure your lips are closed tightly around it. Breathe in slowly and as deeply as you can through your mouth, causing the piston or the ball to rise toward the top of the chamber. Hold your breath for 3-5 seconds, or for as long as possible. If the spirometer includes a coach indicator, use this to guide you  in breathing. Slow down your breathing if the indicator goes above the marked areas. Remove the mouthpiece from your mouth and breathe out normally. The piston or ball will return to the bottom of the chamber. Rest for a few seconds, then repeat the steps 10 or more times. Take your time and take a few normal breaths between deep breaths so that you do not get dizzy or light-headed. Do this every 1-2 hours when you are awake. If the spirometer includes a goal marker to show the highest number you have reached (best effort), use this as a goal to work toward during each repetition. After each set of 10 deep breaths, cough a few times. This will help to make sure that your lungs are clear. If you have an incision on your chest or abdomen from surgery, place a pillow or a rolled-up towel firmly against the incision when you cough. This can help to reduce pain while taking deep breaths and coughing. General tips When you are  able to get out of bed: Walk around often. Continue to take deep breaths and cough in order to clear your lungs. Keep using the incentive spirometer until your health care provider says it is okay to stop using it. If you have been in the hospital, you may be told to keep using the spirometer at home. Contact a health care provider if: You are having difficulty using the spirometer. You have trouble using the spirometer as often as instructed. Your pain medicine is not giving enough relief for you to use the spirometer as told. You have a fever. Get help right away if: You develop shortness of breath. You develop a cough with bloody mucus from the lungs. You have fluid or blood coming from an incision site after you cough. Summary An incentive spirometer is a tool that can help you learn to take long, deep breaths to keep your lungs clear and active. You may be asked to use a spirometer after a surgery, if you have a lung problem or a history of smoking, or if you have been  inactive for a long period of time. Use your incentive spirometer as instructed every 1-2 hours while you are awake. If you have an incision on your chest or abdomen, place a pillow or a rolled-up towel firmly against your incision when you cough. This will help to reduce pain. Get help right away if you have shortness of breath, you cough up bloody mucus, or blood comes from your incision when you cough. This information is not intended to replace advice given to you by your health care provider. Make sure you discuss any questions you have with your health care provider. Document Revised: 12/08/2019 Document Reviewed: 12/08/2019 Elsevier Patient Education  [place].

## 2022-07-20 ENCOUNTER — Encounter
Admission: RE | Admit: 2022-07-20 | Discharge: 2022-07-20 | Disposition: A | Discharge status: Home / Self Care | Payer: PPO | Source: Ambulatory Visit | Attending: Obstetrics and Gynecology | Admitting: Obstetrics and Gynecology

## 2022-07-20 DIAGNOSIS — E785 Hyperlipidemia, unspecified: Secondary | ICD-10-CM

## 2022-07-20 DIAGNOSIS — N950 Postmenopausal bleeding: Secondary | ICD-10-CM | POA: Diagnosis not present

## 2022-07-20 DIAGNOSIS — Z01818 Encounter for other preprocedural examination: Secondary | ICD-10-CM

## 2022-07-20 DIAGNOSIS — Z01810 Encounter for preprocedural cardiovascular examination: Secondary | ICD-10-CM | POA: Diagnosis not present

## 2022-07-20 LAB — CBC
HCT: 38.8 % (ref 36.0–46.0)
Hemoglobin: 12.5 g/dL (ref 12.0–15.0)
MCH: 27.7 pg (ref 26.0–34.0)
MCHC: 32.2 g/dL (ref 30.0–36.0)
MCV: 86 fL (ref 80.0–100.0)
Platelets: 325 10*3/uL (ref 150–400)
RBC: 4.51 MIL/uL (ref 3.87–5.11)
RDW: 12.9 % (ref 11.5–15.5)
WBC: 10.1 10*3/uL (ref 4.0–10.5)
nRBC: 0 % (ref 0.0–0.2)

## 2022-07-20 LAB — TYPE AND SCREEN
ABO/RH(D): A POS
Antibody Screen: NEGATIVE

## 2022-07-20 LAB — BASIC METABOLIC PANEL
Anion gap: 7 (ref 5–15)
BUN: 18 mg/dL (ref 8–23)
CO2: 28 mmol/L (ref 22–32)
Calcium: 8.9 mg/dL (ref 8.9–10.3)
Chloride: 103 mmol/L (ref 98–111)
Creatinine, Ser: 0.72 mg/dL (ref 0.44–1.00)
GFR, Estimated: >60 mL/min (ref >60)
Glucose, Bld: 116 mg/dL — ABNORMAL HIGH (ref 70–99)
Potassium: 3.3 mmol/L — ABNORMAL LOW (ref 3.5–5.1)
Sodium: 138 mmol/L (ref 135–145)

## 2022-07-27 ENCOUNTER — Encounter: Payer: Self-pay | Admitting: Obstetrics and Gynecology

## 2022-07-27 ENCOUNTER — Ambulatory Visit: Payer: PPO | Admitting: Certified Registered Nurse Anesthetist

## 2022-07-27 ENCOUNTER — Other Ambulatory Visit: Payer: Self-pay

## 2022-07-27 ENCOUNTER — Encounter
Admission: RE | Disposition: A | Discharge status: Home / Self Care | Payer: Self-pay | Source: Home / Self Care | Attending: Obstetrics and Gynecology

## 2022-07-27 ENCOUNTER — Ambulatory Visit
Admission: RE | Admit: 2022-07-27 | Discharge: 2022-07-27 | Disposition: A | Discharge status: Home / Self Care | Payer: PPO | Attending: Obstetrics and Gynecology | Admitting: Obstetrics and Gynecology

## 2022-07-27 DIAGNOSIS — N858 Other specified noninflammatory disorders of uterus: Secondary | ICD-10-CM | POA: Diagnosis not present

## 2022-07-27 DIAGNOSIS — N950 Postmenopausal bleeding: Secondary | ICD-10-CM | POA: Insufficient documentation

## 2022-07-27 DIAGNOSIS — N882 Stricture and stenosis of cervix uteri: Secondary | ICD-10-CM | POA: Diagnosis not present

## 2022-07-27 DIAGNOSIS — D259 Leiomyoma of uterus, unspecified: Secondary | ICD-10-CM | POA: Diagnosis not present

## 2022-07-27 DIAGNOSIS — Z79890 Hormone replacement therapy (postmenopausal): Secondary | ICD-10-CM | POA: Insufficient documentation

## 2022-07-27 DIAGNOSIS — E7800 Pure hypercholesterolemia, unspecified: Secondary | ICD-10-CM | POA: Diagnosis not present

## 2022-07-27 HISTORY — DX: Postmenopausal bleeding: N95.0

## 2022-07-27 HISTORY — PX: HYSTEROSCOPY WITH D & C: SHX1775

## 2022-07-27 LAB — ABO/RH: ABO/RH(D): A POS

## 2022-07-27 SURGERY — DILATATION AND CURETTAGE /HYSTEROSCOPY
Anesthesia: General

## 2022-07-27 MED ORDER — EPHEDRINE SULFATE (PRESSORS) 50 MG/ML IJ SOLN
INTRAMUSCULAR | Status: DC | PRN
  Administered 2022-07-27: 5 mg via INTRAVENOUS

## 2022-07-27 MED ORDER — DEXAMETHASONE SODIUM PHOSPHATE 10 MG/ML IJ SOLN
INTRAMUSCULAR | Status: DC | PRN
  Administered 2022-07-27: 10 mg via INTRAVENOUS

## 2022-07-27 MED ORDER — LACTATED RINGERS IV SOLN: INTRAVENOUS | Status: DC

## 2022-07-27 MED ORDER — PROPOFOL 10 MG/ML IV BOLUS
INTRAVENOUS | Status: DC | PRN
  Administered 2022-07-27: 140 mg via INTRAVENOUS

## 2022-07-27 MED ORDER — CHLORHEXIDINE GLUCONATE 0.12 % MT SOLN
OROMUCOSAL | Status: AC
  Administered 2022-07-27: 15 mL via OROMUCOSAL
  Filled 2022-07-27: qty 15

## 2022-07-27 MED ORDER — OXYCODONE HCL 5 MG/5ML PO SOLN: 5.0000 mg | Freq: Once | ORAL | Status: DC | PRN

## 2022-07-27 MED ORDER — KETOROLAC TROMETHAMINE 30 MG/ML IJ SOLN
INTRAMUSCULAR | Status: DC | PRN
  Administered 2022-07-27: 15 mg via INTRAVENOUS

## 2022-07-27 MED ORDER — ORAL CARE MOUTH RINSE: 15.0000 mL | Freq: Once | OROMUCOSAL | Status: AC

## 2022-07-27 MED ORDER — PHENYLEPHRINE HCL-NACL 20-0.9 MG/250ML-% IV SOLN
INTRAVENOUS | Status: DC | PRN
  Administered 2022-07-27: 40 ug/min via INTRAVENOUS

## 2022-07-27 MED ORDER — ACETAMINOPHEN 10 MG/ML IV SOLN
INTRAVENOUS | Status: AC
  Filled 2022-07-27: qty 100

## 2022-07-27 MED ORDER — OXYCODONE HCL 5 MG PO TABS: 5.0000 mg | ORAL_TABLET | Freq: Once | ORAL | Status: DC | PRN

## 2022-07-27 MED ORDER — FENTANYL CITRATE (PF) 100 MCG/2ML IJ SOLN
INTRAMUSCULAR | Status: DC | PRN
  Administered 2022-07-27 (×4): 25 ug via INTRAVENOUS

## 2022-07-27 MED ORDER — LIDOCAINE HCL (CARDIAC) PF 100 MG/5ML IV SOSY
PREFILLED_SYRINGE | INTRAVENOUS | Status: DC | PRN
  Administered 2022-07-27: 50 mg via INTRAVENOUS

## 2022-07-27 MED ORDER — CHLORHEXIDINE GLUCONATE 0.12 % MT SOLN: 15.0000 mL | Freq: Once | OROMUCOSAL | Status: AC

## 2022-07-27 MED ORDER — POVIDONE-IODINE 10 % EX SWAB: 2.0000 | Freq: Once | CUTANEOUS | Status: DC

## 2022-07-27 MED ORDER — ACETAMINOPHEN 10 MG/ML IV SOLN
INTRAVENOUS | Status: DC | PRN
  Administered 2022-07-27: 1000 mg via INTRAVENOUS

## 2022-07-27 MED ORDER — FENTANYL CITRATE (PF) 100 MCG/2ML IJ SOLN
INTRAMUSCULAR | Status: AC
  Filled 2022-07-27: qty 2

## 2022-07-27 MED ORDER — FAMOTIDINE 20 MG PO TABS
ORAL_TABLET | ORAL | Status: AC
  Administered 2022-07-27: 20 mg via ORAL
  Filled 2022-07-27: qty 1

## 2022-07-27 MED ORDER — FAMOTIDINE 20 MG PO TABS: 20.0000 mg | ORAL_TABLET | Freq: Once | ORAL | Status: AC

## 2022-07-27 MED ORDER — LACTATED RINGERS IV SOLN: INTRAVENOUS | Status: DC | PRN

## 2022-07-27 MED ORDER — HYDROMORPHONE HCL 1 MG/ML IJ SOLN: 0.2500 mg | INTRAMUSCULAR | Status: DC | PRN

## 2022-07-27 MED ORDER — ONDANSETRON HCL 4 MG/2ML IJ SOLN
INTRAMUSCULAR | Status: DC | PRN
  Administered 2022-07-27: 4 mg via INTRAVENOUS

## 2022-07-27 SURGICAL SUPPLY — 21 items
BAG PRESSURE INF REUSE 1000 (BAG) ×1 IMPLANT
BASIN KIT SINGLE STR (MISCELLANEOUS) ×1 IMPLANT
DRSG TELFA 3X8 NADH STRL (GAUZE/BANDAGES/DRESSINGS) IMPLANT
ELECT REM PT RETURN 9FT ADLT (ELECTROSURGICAL)
ELECTRODE REM PT RTRN 9FT ADLT (ELECTROSURGICAL) ×1 IMPLANT
GLOVE BIO SURGEON STRL SZ7 (GLOVE) ×1 IMPLANT
GLOVE SURG UNDER LTX SZ7.5 (GLOVE) ×1 IMPLANT
GOWN STRL REUS W/ TWL LRG LVL3 (GOWN DISPOSABLE) ×2 IMPLANT
GOWN STRL REUS W/TWL LRG LVL3 (GOWN DISPOSABLE) ×2
IV NS IRRIG 3000ML ARTHROMATIC (IV SOLUTION) ×1 IMPLANT
KIT PROCEDURE FLUENT (KITS) ×1 IMPLANT
KIT TURNOVER CYSTO (KITS) ×1 IMPLANT
MANIFOLD NEPTUNE II (INSTRUMENTS) ×1 IMPLANT
PACK DNC HYST (MISCELLANEOUS) ×1 IMPLANT
PAD PREP 24X41 OB/GYN DISP (PERSONAL CARE ITEMS) ×1 IMPLANT
SCRUB CHG 4% DYNA-HEX 4OZ (MISCELLANEOUS) ×1 IMPLANT
SEAL ROD LENS SCOPE MYOSURE (ABLATOR) ×1 IMPLANT
SET CYSTO W/LG BORE CLAMP LF (SET/KITS/TRAYS/PACK) IMPLANT
TRAP FLUID SMOKE EVACUATOR (MISCELLANEOUS) ×1 IMPLANT
TUBING CONNECTING 10 (TUBING) ×1 IMPLANT
WATER STERILE IRR 500ML POUR (IV SOLUTION) ×1 IMPLANT

## 2022-07-27 NOTE — Interval H&P Note (Signed)
History and Physical Interval Note:  07/27/2022 7:47 AM  Kimberly Kim  has presented today for surgery, with the diagnosis of post menopausal bleeding, endometrial mass.  The various methods of treatment have been discussed with the patient and family. After consideration of risks, benefits and other options for treatment, the patient has consented to  Procedure(s): DILATATION AND CURETTAGE /HYSTEROSCOPY, MYOMECTOMY (N/A) as a surgical intervention.  The patient's history has been reviewed, patient examined, no change in status, stable for surgery.  I have reviewed the patient's chart and labs.  Questions were answered to the patient's satisfaction.     Benjaman Kindler

## 2022-07-27 NOTE — Op Note (Signed)
Operative Report Hysteroscopy with Dilation and Curettage   Indications: Postmenopausal bleeding   Pre-operative Diagnosis: Fibroid uterus   Post-operative Diagnosis: Cervical stenosis  Procedure: 1. Exam under anesthesia 2. Fractional D&C 3. Hysteroscopy  Surgeon: Benjaman Kindler, MD  Assistant(s):  None  Anesthesia: General LMA anesthesia  Anesthesiologist: Ilene Qua, MD Anesthesiologist: Ilene Qua, MD CRNA: Willette Alma, CRNA  Estimated Blood Loss:  Minimal         Intraoperative medications:  none         Total IV Fluids: 435m  Urine Output: 826m Total Fluid Deficit:  455 mL          Specimens: Endocervical curettings, endometrial curettings (though these may not have reached the uterus)         Complications:  none         Disposition: PACU - hemodynamically stable.         Condition: stable  Findings: Cervix measuring 4 cm by sound but the endometrial cavity was unable to be identified, even under Hydro scopic evaluation.  The cervix itself was stenotic, and required dilation to enter for the curette.  I progressed to centimeter at a time under direct visualization, but was unable to identify the internal cervical os\; normal cervix, vagina, perineum.   Indication for procedure/Consents: [age].o. F here for scheduled surgery for the aforementioned diagnoses.     Risks of surgery were discussed with the patient including but not limited to: bleeding which may require transfusion; infection which may require antibiotics; injury to uterus or surrounding organs; intrauterine scarring which may impair future fertility; need for additional procedures including laparotomy or laparoscopy; and other postoperative/anesthesia complications. Written informed consent was obtained.    Procedure Details:  Fractional D&C only  The patient was taken to the operating room where anesthesia was administered and was found to be adequate.  After a formal and  adequate timeout was performed, she was placed in the dorsal lithotomy position and examined with the above findings. She was then prepped and draped in the sterile manner.   Her bladder was catheterized for an estimated amount of clear, yellow urine. A weighed speculum was then placed in the patient's vagina and a single tooth tenaculum was applied to the anterior lip of the cervix.  Her cervix was serially dilated to 15 FrPakistansing Hanks dilators.  An ECC was performed. The hysteroscope was introduced to reveal the above findings.  After 4 attempts, of gentle dilation in the appropriate direction under direct visualization, I was unable to positively identify the intrauterine cavity definitively.  A gentle curette was taken of a possible cavity space, and the procedures terminated early for safety.  The tenaculum was removed from the anterior lip of the cervix and the vaginal speculum was removed after applying pressure for good hemostasis.   The patient tolerated the procedure well and was taken to the recovery area awake and in stable condition. She received iv acetaminophen and Toradol prior to leaving the OR.  The patient will be discharged to home as per PACU criteria. Routine postoperative instructions given.  She was prescribed Ibuprofen and Colace.  She will follow up in the clinic in two weeks for postoperative evaluation.  Since she had had postmenopausal bleeding in 2021, consideration for an MRI versus hysterectomy is recommended.

## 2022-07-27 NOTE — Transfer of Care (Signed)
Immediate Anesthesia Transfer of Care Note  Patient: Kimberly Kim  Procedure(s) Performed: DILATATION AND CURETTAGE /HYSTEROSCOPY  Patient Location: PACU  Anesthesia Type:General  Level of Consciousness: awake, alert  and oriented  Airway & Oxygen Therapy: Patient Spontanous Breathing and Patient connected to face mask oxygen  Post-op Assessment: Report given to RN and Post -op Vital signs reviewed and stable  Post vital signs: Reviewed and stable  Last Vitals:  Vitals Value Taken Time  BP 146/65 07/27/22 1021  Temp 36.4 C 07/27/22 1021  Pulse 71 07/27/22 1023  Resp 12 07/27/22 1023  SpO2 100 % 07/27/22 1023  Vitals shown include unvalidated device data.  Last Pain:  Vitals:   07/27/22 0743  TempSrc: Temporal  PainSc: 0-No pain         Complications: No notable events documented.

## 2022-07-27 NOTE — Anesthesia Procedure Notes (Signed)
Procedure Name: LMA Insertion Date/Time: 07/27/2022 9:27 AM  Performed by: Willette Alma, CRNAPre-anesthesia Checklist: Patient identified, Patient being monitored, Timeout performed, Emergency Drugs available and Suction available Patient Re-evaluated:Patient Re-evaluated prior to induction Oxygen Delivery Method: Circle system utilized Preoxygenation: Pre-oxygenation with 100% oxygen Induction Type: IV induction Ventilation: Mask ventilation without difficulty LMA: LMA inserted Tube type: Oral Tube size: 4.0 mm Number of attempts: 1 Placement Confirmation: positive ETCO2 and breath sounds checked- equal and bilateral Tube secured with: Tape Dental Injury: Teeth and Oropharynx as per pre-operative assessment

## 2022-07-27 NOTE — Anesthesia Preprocedure Evaluation (Addendum)
Anesthesia Evaluation  Patient identified by MRN, date of birth, ID band Patient awake    Reviewed: Allergy & Precautions, NPO status , Patient's Chart, lab work & pertinent test results  History of Anesthesia Complications Negative for: history of anesthetic complications  Airway Mallampati: III  TM Distance: >3 FB Neck ROM: full    Dental  (+) Chipped   Pulmonary neg pulmonary ROS,    Pulmonary exam normal        Cardiovascular Exercise Tolerance: Good Normal cardiovascular exam     Neuro/Psych negative neurological ROS  negative psych ROS   GI/Hepatic negative GI ROS, Neg liver ROS,   Endo/Other  negative endocrine ROS  Renal/GU      Musculoskeletal   Abdominal   Peds  Hematology negative hematology ROS (+)   Anesthesia Other Findings Past Medical History: No date: Anemia No date: Hyperlipidemia No date: Postmenopausal bleeding No date: Vitamin D deficiency  Past Surgical History: No date: COLONOSCOPY No date: SHOULDER ARTHROSCOPY; Right  BMI    Body Mass Index: 23.78 kg/m      Reproductive/Obstetrics negative OB ROS                            Anesthesia Physical Anesthesia Plan  ASA: 2  Anesthesia Plan: General LMA   Post-op Pain Management: Ofirmev IV (intra-op)* and Toradol IV (intra-op)*   Induction: Intravenous  PONV Risk Score and Plan: 4 or greater and Dexamethasone, Ondansetron, Midazolam and Treatment may vary due to age or medical condition  Airway Management Planned: LMA  Additional Equipment:   Intra-op Plan:   Post-operative Plan: Extubation in OR  Informed Consent: I have reviewed the patients History and Physical, chart, labs and discussed the procedure including the risks, benefits and alternatives for the proposed anesthesia with the patient or authorized representative who has indicated his/her understanding and acceptance.     Dental  Advisory Given  Plan Discussed with: Anesthesiologist, CRNA and Surgeon  Anesthesia Plan Comments: (Patient consented for risks of anesthesia including but not limited to:  - adverse reactions to medications - damage to eyes, teeth, lips or other oral mucosa - nerve damage due to positioning  - sore throat or hoarseness - Damage to heart, brain, nerves, lungs, other parts of body or loss of life  Patient voiced understanding.)       Anesthesia Quick Evaluation

## 2022-07-27 NOTE — Anesthesia Postprocedure Evaluation (Signed)
Anesthesia Post Note  Patient: Kimberly Kim  Procedure(s) Performed: DILATATION AND CURETTAGE /HYSTEROSCOPY  Patient location during evaluation: PACU Anesthesia Type: General Level of consciousness: awake and alert Pain management: pain level controlled Vital Signs Assessment: post-procedure vital signs reviewed and stable Respiratory status: spontaneous breathing, nonlabored ventilation, respiratory function stable and patient connected to nasal cannula oxygen Cardiovascular status: blood pressure returned to baseline and stable Postop Assessment: no apparent nausea or vomiting Anesthetic complications: no   No notable events documented.   Last Vitals:  Vitals:   07/27/22 1030 07/27/22 1045  BP: (!) 132/52 (!) 135/45  Pulse: 63 65  Resp: 16 18  Temp:    SpO2: 100% 98%    Last Pain:  Vitals:   07/27/22 1045  TempSrc:   PainSc: 0-No pain                 Ilene Qua

## 2022-07-27 NOTE — Discharge Instructions (Addendum)
Discharge instructions after a hysteroscopy with dilation and curettage  Signs and Symptoms to Report  Call our office at [phone] if you have any of the following:    Fever over 100.4 degrees or higher  Severe stomach pain not relieved with pain medications  Bright red bleeding that's heavier than a period that does not slow with rest after the first 24 hours  To go the bathroom a lot (frequency), you can't hold your urine (urgency), or it hurts when you empty your bladder (urinate)  Chest pain  Shortness of breath  Pain in the calves of your legs  Severe nausea and vomiting not relieved with anti-nausea medications  Any concerns  What You Can Expect after Surgery  You may see some pink tinged, bloody fluid. This is normal. You may also have cramping for several days.   Activities after Your Discharge Follow these guidelines to help speed your recovery at home:  Don't drive if you are in pain or taking narcotic pain medicine. You may drive when you can safely slam on the brakes, turn the wheel forcefully, and rotate your torso comfortably. This is typically 4-7 days. Practice in a parking lot or side street prior to attempting to drive regularly.   Ask others to help with household chores for 4 weeks.  Don't do strenuous activities, exercises, or sports like vacuuming, tennis, squash, etc. until your doctor says it is safe to do so.  Walk as you feel able. Rest often since it may take a week or two for your energy level to return to normal.   You may climb stairs  Avoid constipation:   -Eat fruits, vegetables, and whole grains. Eat small meals as your appetite will take time to return to normal.   -Drink 6 to 8 glasses of water each day unless your doctor has told you to limit your fluids.   -Use a laxative or stool softener as needed if constipation becomes a problem. You may take Miralax, metamucil, Citrucil, Colace, Senekot, FiberCon, etc. If this does not relieve the  constipation, try two tablespoons of Milk Of Magnesia every 8 hours until your bowels move.   You may shower.   Do not get in a hot tub, swimming pool, etc. until your doctor agrees.  Do not douche, use tampons, or have sex until your doctor says it is okay, usually about 2 weeks.  Take your pain medicine when you need it. The medicine may not work as well if the pain is bad.  Take the medicines you were taking before surgery. Other medications you might need are pain medications (ibuprofen), medications for constipation (Colace) and nausea medications (Zofran).      AMBULATORY SURGERY  DISCHARGE INSTRUCTIONS   The drugs that you were given will stay in your system until tomorrow so for the next 24 hours you should not:  Drive an automobile Make any legal decisions Drink any alcoholic beverage   You may resume regular meals tomorrow.  Today it is better to start with liquids and gradually work up to solid foods.  You may eat anything you prefer, but it is better to start with liquids, then soup and crackers, and gradually work up to solid foods.   Please notify your doctor immediately if you have any unusual bleeding, trouble breathing, redness and pain at the surgery site, drainage, fever, or pain not relieved by medication.    Additional Instructions:        Please contact your physician   with any problems or Same Day Surgery at [phone], Monday through Friday 6 am to 4 pm, or [facility] number at [phone]. 

## 2022-07-28 ENCOUNTER — Encounter: Payer: Self-pay | Admitting: Obstetrics and Gynecology

## 2022-07-28 LAB — SURGICAL PATHOLOGY

## 2022-12-22 DIAGNOSIS — H814 Vertigo of central origin: Secondary | ICD-10-CM | POA: Diagnosis not present

## 2022-12-22 DIAGNOSIS — E7849 Other hyperlipidemia: Secondary | ICD-10-CM | POA: Diagnosis not present

## 2022-12-22 DIAGNOSIS — R42 Dizziness and giddiness: Secondary | ICD-10-CM | POA: Diagnosis not present

## 2023-01-03 DIAGNOSIS — R42 Dizziness and giddiness: Secondary | ICD-10-CM | POA: Diagnosis not present

## 2023-02-23 DIAGNOSIS — M654 Radial styloid tenosynovitis [de Quervain]: Secondary | ICD-10-CM | POA: Diagnosis not present

## 2023-05-23 DIAGNOSIS — E7849 Other hyperlipidemia: Secondary | ICD-10-CM | POA: Diagnosis not present

## 2023-05-23 DIAGNOSIS — E559 Vitamin D deficiency, unspecified: Secondary | ICD-10-CM | POA: Diagnosis not present

## 2023-05-23 DIAGNOSIS — Z0000 Encounter for general adult medical examination without abnormal findings: Secondary | ICD-10-CM | POA: Diagnosis not present

## 2023-05-23 DIAGNOSIS — N950 Postmenopausal bleeding: Secondary | ICD-10-CM | POA: Diagnosis not present

## 2023-05-28 ENCOUNTER — Other Ambulatory Visit: Payer: Self-pay | Admitting: Internal Medicine

## 2023-05-28 DIAGNOSIS — Z1231 Encounter for screening mammogram for malignant neoplasm of breast: Secondary | ICD-10-CM

## 2023-05-30 DIAGNOSIS — E7849 Other hyperlipidemia: Secondary | ICD-10-CM | POA: Diagnosis not present

## 2023-05-30 DIAGNOSIS — M8589 Other specified disorders of bone density and structure, multiple sites: Secondary | ICD-10-CM | POA: Diagnosis not present

## 2023-05-30 DIAGNOSIS — Z0000 Encounter for general adult medical examination without abnormal findings: Secondary | ICD-10-CM | POA: Diagnosis not present

## 2023-05-30 DIAGNOSIS — E559 Vitamin D deficiency, unspecified: Secondary | ICD-10-CM | POA: Diagnosis not present

## 2023-05-31 ENCOUNTER — Other Ambulatory Visit: Payer: Self-pay | Admitting: Internal Medicine

## 2023-05-31 DIAGNOSIS — E7849 Other hyperlipidemia: Secondary | ICD-10-CM

## 2023-06-07 ENCOUNTER — Ambulatory Visit
Admission: RE | Admit: 2023-06-07 | Discharge: 2023-06-07 | Disposition: A | Discharge status: Home / Self Care | Payer: PPO | Source: Ambulatory Visit | Attending: Internal Medicine | Admitting: Internal Medicine

## 2023-06-07 ENCOUNTER — Ambulatory Visit
Admission: RE | Admit: 2023-06-07 | Discharge: 2023-06-07 | Disposition: A | Discharge status: Home / Self Care | Payer: Self-pay | Source: Ambulatory Visit | Attending: Internal Medicine | Admitting: Internal Medicine

## 2023-06-07 DIAGNOSIS — E7849 Other hyperlipidemia: Secondary | ICD-10-CM | POA: Insufficient documentation

## 2023-06-07 DIAGNOSIS — Z1231 Encounter for screening mammogram for malignant neoplasm of breast: Secondary | ICD-10-CM

## 2023-08-17 DIAGNOSIS — M533 Sacrococcygeal disorders, not elsewhere classified: Secondary | ICD-10-CM | POA: Diagnosis not present

## 2023-08-17 DIAGNOSIS — M5136 Other intervertebral disc degeneration, lumbar region with discogenic back pain only: Secondary | ICD-10-CM | POA: Diagnosis not present

## 2023-08-17 DIAGNOSIS — M25552 Pain in left hip: Secondary | ICD-10-CM | POA: Diagnosis not present

## 2023-08-17 DIAGNOSIS — M47816 Spondylosis without myelopathy or radiculopathy, lumbar region: Secondary | ICD-10-CM | POA: Diagnosis not present

## 2023-08-17 DIAGNOSIS — G8929 Other chronic pain: Secondary | ICD-10-CM | POA: Diagnosis not present

## 2023-09-04 DIAGNOSIS — M5459 Other low back pain: Secondary | ICD-10-CM | POA: Diagnosis not present

## 2023-09-04 DIAGNOSIS — M7062 Trochanteric bursitis, left hip: Secondary | ICD-10-CM | POA: Diagnosis not present

## 2023-10-04 DIAGNOSIS — M7062 Trochanteric bursitis, left hip: Secondary | ICD-10-CM | POA: Diagnosis not present

## 2023-10-08 DIAGNOSIS — M7061 Trochanteric bursitis, right hip: Secondary | ICD-10-CM | POA: Diagnosis not present

## 2024-02-28 DIAGNOSIS — E7849 Other hyperlipidemia: Secondary | ICD-10-CM | POA: Diagnosis not present

## 2024-02-28 DIAGNOSIS — R42 Dizziness and giddiness: Secondary | ICD-10-CM | POA: Diagnosis not present

## 2024-02-29 ENCOUNTER — Other Ambulatory Visit: Payer: Self-pay | Admitting: Internal Medicine

## 2024-02-29 DIAGNOSIS — R42 Dizziness and giddiness: Secondary | ICD-10-CM

## 2024-03-05 ENCOUNTER — Ambulatory Visit
Admission: RE | Admit: 2024-03-05 | Discharge: 2024-03-05 | Disposition: A | Discharge status: Home / Self Care | Source: Ambulatory Visit | Attending: Internal Medicine | Admitting: Internal Medicine

## 2024-03-05 DIAGNOSIS — R42 Dizziness and giddiness: Secondary | ICD-10-CM | POA: Insufficient documentation

## 2024-05-23 DIAGNOSIS — E7849 Other hyperlipidemia: Secondary | ICD-10-CM | POA: Diagnosis not present

## 2024-05-23 DIAGNOSIS — M1612 Unilateral primary osteoarthritis, left hip: Secondary | ICD-10-CM | POA: Diagnosis not present

## 2024-05-23 DIAGNOSIS — Z0000 Encounter for general adult medical examination without abnormal findings: Secondary | ICD-10-CM | POA: Diagnosis not present

## 2024-05-23 DIAGNOSIS — M7062 Trochanteric bursitis, left hip: Secondary | ICD-10-CM | POA: Diagnosis not present

## 2024-05-23 DIAGNOSIS — E559 Vitamin D deficiency, unspecified: Secondary | ICD-10-CM | POA: Diagnosis not present

## 2024-05-30 DIAGNOSIS — Z131 Encounter for screening for diabetes mellitus: Secondary | ICD-10-CM | POA: Diagnosis not present

## 2024-05-30 DIAGNOSIS — M533 Sacrococcygeal disorders, not elsewhere classified: Secondary | ICD-10-CM | POA: Diagnosis not present

## 2024-05-30 DIAGNOSIS — E7849 Other hyperlipidemia: Secondary | ICD-10-CM | POA: Diagnosis not present

## 2024-05-30 DIAGNOSIS — Z0000 Encounter for general adult medical examination without abnormal findings: Secondary | ICD-10-CM | POA: Diagnosis not present

## 2024-05-30 DIAGNOSIS — Z1331 Encounter for screening for depression: Secondary | ICD-10-CM | POA: Diagnosis not present

## 2024-05-30 DIAGNOSIS — E559 Vitamin D deficiency, unspecified: Secondary | ICD-10-CM | POA: Diagnosis not present

## 2024-07-17 DIAGNOSIS — H43812 Vitreous degeneration, left eye: Secondary | ICD-10-CM | POA: Diagnosis not present

## 2024-07-17 DIAGNOSIS — H2511 Age-related nuclear cataract, right eye: Secondary | ICD-10-CM | POA: Diagnosis not present

## 2024-07-17 DIAGNOSIS — H2512 Age-related nuclear cataract, left eye: Secondary | ICD-10-CM | POA: Diagnosis not present

## 2024-07-17 DIAGNOSIS — H04123 Dry eye syndrome of bilateral lacrimal glands: Secondary | ICD-10-CM | POA: Diagnosis not present

## 2024-07-24 DIAGNOSIS — H2513 Age-related nuclear cataract, bilateral: Secondary | ICD-10-CM | POA: Diagnosis not present

## 2024-07-24 DIAGNOSIS — H2512 Age-related nuclear cataract, left eye: Secondary | ICD-10-CM | POA: Diagnosis not present

## 2024-07-31 ENCOUNTER — Encounter: Payer: Self-pay | Admitting: Ophthalmology

## 2024-08-04 NOTE — Discharge Instructions (Signed)

## 2024-08-06 ENCOUNTER — Ambulatory Visit
Admission: RE | Admit: 2024-08-06 | Discharge: 2024-08-06 | Disposition: A | Discharge status: Home / Self Care | Attending: Ophthalmology | Admitting: Ophthalmology

## 2024-08-06 ENCOUNTER — Encounter
Admission: RE | Disposition: A | Discharge status: Home / Self Care | Payer: Self-pay | Source: Home / Self Care | Attending: Ophthalmology

## 2024-08-06 ENCOUNTER — Ambulatory Visit: Payer: Self-pay | Admitting: Anesthesiology

## 2024-08-06 ENCOUNTER — Other Ambulatory Visit: Payer: Self-pay

## 2024-08-06 ENCOUNTER — Encounter: Payer: Self-pay | Admitting: Ophthalmology

## 2024-08-06 DIAGNOSIS — H2512 Age-related nuclear cataract, left eye: Secondary | ICD-10-CM | POA: Insufficient documentation

## 2024-08-06 HISTORY — DX: Unspecified osteoarthritis, unspecified site: M19.90

## 2024-08-06 HISTORY — PX: CATARACT EXTRACTION W/PHACO: SHX586

## 2024-08-06 SURGERY — PHACOEMULSIFICATION, CATARACT, WITH IOL INSERTION
Anesthesia: Monitor Anesthesia Care | Site: Eye | Laterality: Left

## 2024-08-06 MED ORDER — NEOMYCIN-POLYMYXIN-DEXAMETH 3.5-10000-0.1 OP OINT
TOPICAL_OINTMENT | OPHTHALMIC | Status: DC | PRN
  Administered 2024-08-06: 1 via OPHTHALMIC

## 2024-08-06 MED ORDER — BRIMONIDINE TARTRATE-TIMOLOL 0.2-0.5 % OP SOLN
OPHTHALMIC | Status: DC | PRN
Start: 2024-08-06 — End: 2024-08-06
  Administered 2024-08-06: 1 [drp] via OPHTHALMIC

## 2024-08-06 MED ORDER — TETRACAINE HCL 0.5 % OP SOLN
1.0000 [drp] | OPHTHALMIC | Status: DC | PRN
  Administered 2024-08-06 (×3): 1 [drp] via OPHTHALMIC

## 2024-08-06 MED ORDER — FENTANYL CITRATE (PF) 100 MCG/2ML IJ SOLN
INTRAMUSCULAR | Status: AC
  Filled 2024-08-06: qty 2

## 2024-08-06 MED ORDER — SIGHTPATH DOSE#1 BSS IO SOLN
INTRAOCULAR | Status: DC | PRN
  Administered 2024-08-06: 15 mL via INTRAOCULAR

## 2024-08-06 MED ORDER — CYCLOPENTOLATE HCL 2 % OP SOLN
1.0000 [drp] | OPHTHALMIC | Status: AC
  Administered 2024-08-06 (×3): 1 [drp] via OPHTHALMIC

## 2024-08-06 MED ORDER — FENTANYL CITRATE (PF) 100 MCG/2ML IJ SOLN
INTRAMUSCULAR | Status: DC | PRN
  Administered 2024-08-06: 50 ug via INTRAVENOUS

## 2024-08-06 MED ORDER — LIDOCAINE HCL (PF) 2 % IJ SOLN
INTRAOCULAR | Status: DC | PRN
  Administered 2024-08-06: 2 mL

## 2024-08-06 MED ORDER — EPINEPHRINE PF 1 MG/ML IJ SOLN
INTRAMUSCULAR | Status: DC | PRN
  Administered 2024-08-06: 54 mL via OPHTHALMIC

## 2024-08-06 MED ORDER — CEFUROXIME OPHTHALMIC INJECTION 1 MG/0.1 ML
INJECTION | OPHTHALMIC | Status: DC | PRN
  Administered 2024-08-06: .1 mL via INTRACAMERAL

## 2024-08-06 MED ORDER — MIDAZOLAM HCL (PF) 2 MG/2ML IJ SOLN
INTRAMUSCULAR | Status: DC | PRN
Start: 2024-08-06 — End: 2024-08-06
  Administered 2024-08-06: 1 mg via INTRAVENOUS

## 2024-08-06 MED ORDER — CYCLOPENTOLATE HCL 2 % OP SOLN
OPHTHALMIC | Status: AC
  Filled 2024-08-06: qty 2

## 2024-08-06 MED ORDER — PHENYLEPHRINE HCL 10 % OP SOLN
1.0000 [drp] | OPHTHALMIC | Status: AC
  Administered 2024-08-06 (×3): 1 [drp] via OPHTHALMIC

## 2024-08-06 MED ORDER — MIDAZOLAM HCL 2 MG/2ML IJ SOLN
INTRAMUSCULAR | Status: AC
  Filled 2024-08-06: qty 2

## 2024-08-06 MED ORDER — TETRACAINE HCL 0.5 % OP SOLN
OPHTHALMIC | Status: AC
Start: 2024-08-06 — End: 2024-08-06
  Filled 2024-08-06: qty 4

## 2024-08-06 MED ORDER — SIGHTPATH DOSE#1 NA HYALUR & NA CHOND-NA HYALUR IO KIT
PACK | INTRAOCULAR | Status: DC | PRN
  Administered 2024-08-06: 1 via OPHTHALMIC

## 2024-08-06 MED ORDER — PHENYLEPHRINE HCL 10 % OP SOLN
OPHTHALMIC | Status: AC
  Filled 2024-08-06: qty 5

## 2024-08-06 SURGICAL SUPPLY — 8 items
FEE CATARACT SUITE SIGHTPATH (MISCELLANEOUS) ×1 IMPLANT
GLOVE BIOGEL PI IND STRL 8 (GLOVE) ×1 IMPLANT
GLOVE SURG LX STRL 7.5 STRW (GLOVE) ×1 IMPLANT
GLOVE SURG SYN 6.5 PF PI BL (GLOVE) ×1 IMPLANT
LENS IOL TECNIS EYHANCE 13.0 (Intraocular Lens) IMPLANT
NDL FILTER BLUNT 18X1 1/2 (NEEDLE) ×1 IMPLANT
NEEDLE FILTER BLUNT 18X1 1/2 (NEEDLE) ×1 IMPLANT
SYR 3ML LL SCALE MARK (SYRINGE) ×1 IMPLANT

## 2024-08-06 NOTE — Op Note (Signed)
 OPERATIVE NOTE  Kimberly Kim [id number] 08/06/2024   PREOPERATIVE DIAGNOSIS:  Nuclear sclerotic cataract left eye. H25.12   POSTOPERATIVE DIAGNOSIS:    Nuclear sclerotic cataract left eye.     PROCEDURE:  Phacoemusification with posterior chamber intraocular lens placement of the left eye  Ultrasound time: Procedure(s): PHACOEMULSIFICATION, CATARACT, WITH IOL INSERTION 8.56 00:44.2 (Left)  LENS:   Implant Name Type Inv. Item Serial No. Manufacturer Lot No. LRB No. Used Action  LENS IOL TECNIS EYHANCE 13.0 - [id number] Intraocular Lens LENS IOL TECNIS EYHANCE 13.0 [id number] [employer]  Left 1 Implanted      SURGEON:  Dene FABIENE Etienne, MD   ANESTHESIA:  Topical with tetracaine drops and 2% Xylocaine  jelly, augmented with 1% preservative-free intracameral lidocaine .    COMPLICATIONS:  None.   DESCRIPTION OF PROCEDURE:  The patient was identified in the holding room and transported to the operating room and placed in the supine position under the operating microscope.  The left eye was identified as the operative eye and it was prepped and draped in the usual sterile ophthalmic fashion.   A 1 millimeter clear-corneal paracentesis was made at the 1:30 position.  0.5 ml of preservative-free 1% lidocaine  was injected into the anterior chamber.  The anterior chamber was filled with Viscoat viscoelastic.  A 2.4 millimeter keratome was used to make a near-clear corneal incision at the 10:30 position.  .  A curvilinear capsulorrhexis was made with a cystotome and capsulorrhexis forceps.  Balanced salt solution was used to hydrodissect and hydrodelineate the nucleus.   Phacoemulsification was then used in stop and chop fashion to remove the lens nucleus and epinucleus.  The remaining cortex was then removed using the irrigation and aspiration handpiece. Provisc was then placed into the capsular bag to distend it for lens placement.  A lens was then injected into the capsular bag.   The remaining viscoelastic was aspirated.   Wounds were hydrated with balanced salt solution.  The anterior chamber was inflated to a physiologic pressure with balanced salt solution.  No wound leaks were noted. Cefuroxime 0.1 ml of a 10mg /ml solution was injected into the anterior chamber for a dose of 1 mg of intracameral antibiotic at the completion of the case.   Timolol and Brimonidine drops and Maxitrol ointment  were applied to the eye.  The patient was taken to the recovery room in stable condition without complications of anesthesia or surgery.  [name] 08/06/2024, 10:02 AM

## 2024-08-06 NOTE — H&P (Signed)
 [facility]   Primary Care Physician:  Fernande Ophelia JINNY DOUGLAS, MD Ophthalmologist: Dr. Dene Etienne  Pre-Procedure History & Physical: HPI:  Kimberly Kim is a 78 y.o. female here for ophthalmic surgery.   Past Medical History:  Diagnosis Date   Anemia    Arthritis    Hyperlipidemia    Postmenopausal bleeding    Vitamin D deficiency     Past Surgical History:  Procedure Laterality Date   COLONOSCOPY     HYSTEROSCOPY WITH D & C N/A 07/27/2022   Procedure: DILATATION AND CURETTAGE /HYSTEROSCOPY;  Surgeon: Verdon Keen, MD;  Location: [facility];  Service: Gynecology;  Laterality: N/A;   SHOULDER ARTHROSCOPY Right     Prior to Admission medications   Medication Sig Start Date End Date Taking? Authorizing Provider  Cholecalciferol (VITAMIN D3) 50 MCG (2000 UT) TABS Take 1 tablet by mouth daily at 6 (six) AM.   Yes [provider]    Allergies as of 07/21/2024   (No Known Allergies)    Family History  Problem Relation Age of Onset   Breast cancer Neg Hx     Social History   Socioeconomic History   Marital status: Married    Spouse name: Elsie   Number of children: 2   Years of education: Not on file   Highest education level: Not on file  Occupational History   Not on file  Tobacco Use   Smoking status: Never   Smokeless tobacco: Never  Vaping Use   Vaping status: Never Used  Substance and Sexual Activity   Alcohol  use: Yes    Comment: occaisionally   Drug use: Never   Sexual activity: Not on file  Other Topics Concern   Not on file  Social History Narrative   Not on file   Social Drivers of Health   Financial Resource Strain: Low Risk  (05/28/2024)   Received from [facility]   Overall Financial Resource Strain (CARDIA)    Difficulty of Paying Living Expenses: Not hard at all  Food Insecurity: No Food Insecurity (05/28/2024)   Received from [facility]   Hunger Vital Sign    Within  the past 12 months, you worried that your food would run out before you got the money to buy more.: Never true    Within the past 12 months, the food you bought just didn't last and you didn't have money to get more.: Never true  Transportation Needs: No Transportation Needs (05/28/2024)   Received from [facility] - Transportation    In the past 12 months, has lack of transportation kept you from medical appointments or from getting medications?: No    Lack of Transportation (Non-Medical): No  Physical Activity: Not on file  Stress: Not on file  Social Connections: Not on file  Intimate Partner Violence: Not on file    Review of Systems: See HPI, otherwise negative ROS  Physical Exam: BP (!) 163/46   Pulse 63   Temp 97.6 F (36.4 C) (Temporal)   Ht 5' 2 (1.575 m)   Wt 57.6 kg   SpO2 99%   BMI 23.23 kg/m  General:   Alert,  pleasant and cooperative in NAD Head:  Normocephalic and atraumatic. Lungs:  Clear to auscultation.    Heart:  Regular rate and rhythm.   Impression/Plan: [name] is here for ophthalmic surgery.  Risks, benefits, limitations, and alternatives regarding ophthalmic surgery have been  reviewed with the patient.  Questions have been answered.  All parties agreeable.   MITTIE GASKIN, MD  08/06/2024, 9:23 AM

## 2024-08-06 NOTE — Transfer of Care (Signed)
 Immediate Anesthesia Transfer of Care Note  Patient: Kimberly Kim  Procedure(s) Performed: PHACOEMULSIFICATION, CATARACT, WITH IOL INSERTION 8.56 00:44.2 (Left: Eye)  Patient Location: PACU  Anesthesia Type: MAC  Level of Consciousness: awake, alert  and patient cooperative  Airway and Oxygen Therapy: Patient Spontanous Breathing and Patient connected to supplemental oxygen  Post-op Assessment: Post-op Vital signs reviewed, Patient's Cardiovascular Status Stable, Respiratory Function Stable, Patent Airway and No signs of Nausea or vomiting  Post-op Vital Signs: Reviewed and stable  Complications: No notable events documented.

## 2024-08-06 NOTE — Anesthesia Preprocedure Evaluation (Signed)
 Anesthesia Evaluation  Patient identified by MRN, date of birth, ID band Patient awake    Reviewed: Allergy & Precautions, NPO status , Patient's Chart, lab work & pertinent test results  History of Anesthesia Complications Negative for: history of anesthetic complications  Airway Mallampati: III  TM Distance: >3 FB Neck ROM: full    Dental  (+) Chipped, Dental Advidsory Given   Pulmonary neg pulmonary ROS   Pulmonary exam normal        Cardiovascular Exercise Tolerance: Good Normal cardiovascular exam     Neuro/Psych negative neurological ROS  negative psych ROS   GI/Hepatic negative GI ROS, Neg liver ROS,,,  Endo/Other  negative endocrine ROS    Renal/GU      Musculoskeletal   Abdominal   Peds  Hematology negative hematology ROS (+)   Anesthesia Other Findings Past Medical History: No date: Anemia No date: Hyperlipidemia No date: Postmenopausal bleeding No date: Vitamin D deficiency  Past Surgical History: No date: COLONOSCOPY No date: SHOULDER ARTHROSCOPY; Right  BMI    Body Mass Index: 23.78 kg/m      Reproductive/Obstetrics negative OB ROS                              Anesthesia Physical Anesthesia Plan  ASA: 2  Anesthesia Plan: MAC   Post-op Pain Management:    Induction: Intravenous  PONV Risk Score and Plan: 3 and Midazolam and Treatment may vary due to age or medical condition  Airway Management Planned: Natural Airway and Nasal Cannula  Additional Equipment:   Intra-op Plan:   Post-operative Plan:   Informed Consent: I have reviewed the patients History and Physical, chart, labs and discussed the procedure including the risks, benefits and alternatives for the proposed anesthesia with the patient or authorized representative who has indicated his/her understanding and acceptance.     Dental Advisory Given  Plan Discussed with: Anesthesiologist,  CRNA and Surgeon  Anesthesia Plan Comments: (Patient consented for risks of anesthesia including but not limited to:  - adverse reactions to medications - damage to eyes, teeth, lips or other oral mucosa - nerve damage due to positioning  - sore throat or hoarseness - Damage to heart, brain, nerves, lungs, other parts of body or loss of life  Patient voiced understanding.)         Anesthesia Quick Evaluation

## 2024-08-06 NOTE — Anesthesia Postprocedure Evaluation (Signed)
 Anesthesia Post Note  Patient: Kimberly Kim  Procedure(s) Performed: PHACOEMULSIFICATION, CATARACT, WITH IOL INSERTION 8.56 00:44.2 (Left: Eye)  Patient location during evaluation: PACU Anesthesia Type: MAC Level of consciousness: awake and alert Pain management: pain level controlled Vital Signs Assessment: post-procedure vital signs reviewed and stable Respiratory status: spontaneous breathing, nonlabored ventilation, respiratory function stable and patient connected to nasal cannula oxygen Cardiovascular status: stable and blood pressure returned to baseline Postop Assessment: no apparent nausea or vomiting Anesthetic complications: no   No notable events documented.   Last Vitals:  Vitals:   08/06/24 0854 08/06/24 1005  BP: (!) 163/46 (!) 142/56  Pulse: 63 65  Resp:  16  Temp: 36.4 C 36.7 C  SpO2: 99% 98%    Last Pain:  Vitals:   08/06/24 1010  TempSrc:   PainSc: 0-No pain                 Prentice Murphy

## 2024-08-07 DIAGNOSIS — H2511 Age-related nuclear cataract, right eye: Secondary | ICD-10-CM | POA: Diagnosis not present

## 2024-08-11 ENCOUNTER — Encounter: Payer: Self-pay | Admitting: Ophthalmology

## 2024-08-11 NOTE — Anesthesia Preprocedure Evaluation (Addendum)
 Anesthesia Evaluation  Patient identified by MRN, date of birth, ID band Patient awake    Reviewed: Allergy & Precautions, H&P , NPO status , Patient's Chart, lab work & pertinent test results  Airway Mallampati: III  TM Distance: <3 FB Neck ROM: Full    Dental no notable dental hx.  Teeth intact :   Pulmonary neg pulmonary ROS   Pulmonary exam normal breath sounds clear to auscultation       Cardiovascular negative cardio ROS Normal cardiovascular exam Rhythm:Regular Rate:Normal     Neuro/Psych negative neurological ROS  negative psych ROS   GI/Hepatic negative GI ROS, Neg liver ROS,,,  Endo/Other  negative endocrine ROS    Renal/GU negative Renal ROS  negative genitourinary   Musculoskeletal negative musculoskeletal ROS (+) Arthritis ,    Abdominal   Peds negative pediatric ROS (+)  Hematology negative hematology ROS (+) Blood dyscrasia, anemia   Anesthesia Other Findings Previous cataract 08-06-24 Dr. Dario  Medical History  Hyperlipidemia Vitamin D deficiency Anemia Postmenopausal bleeding Arthritis Dizziness and giddiness    Reproductive/Obstetrics negative OB ROS                              Anesthesia Physical Anesthesia Plan  ASA: 2  Anesthesia Plan: MAC   Post-op Pain Management:    Induction: Intravenous  PONV Risk Score and Plan:   Airway Management Planned: Natural Airway and Nasal Cannula  Additional Equipment:   Intra-op Plan:   Post-operative Plan:   Informed Consent: I have reviewed the patients History and Physical, chart, labs and discussed the procedure including the risks, benefits and alternatives for the proposed anesthesia with the patient or authorized representative who has indicated his/her understanding and acceptance.     Dental Advisory Given  Plan Discussed with: Anesthesiologist, CRNA and Surgeon  Anesthesia Plan Comments:  (Patient consented for risks of anesthesia including but not limited to:  - adverse reactions to medications - damage to eyes, teeth, lips or other oral mucosa - nerve damage due to positioning  - sore throat or hoarseness - Damage to heart, brain, nerves, lungs, other parts of body or loss of life  Patient voiced understanding and assent.)         Anesthesia Quick Evaluation

## 2024-08-19 NOTE — Discharge Instructions (Signed)

## 2024-08-20 ENCOUNTER — Ambulatory Visit: Payer: Self-pay | Admitting: Anesthesiology

## 2024-08-20 ENCOUNTER — Ambulatory Visit
Admission: RE | Admit: 2024-08-20 | Discharge: 2024-08-20 | Disposition: A | Discharge status: Home / Self Care | Attending: Ophthalmology | Admitting: Ophthalmology

## 2024-08-20 ENCOUNTER — Encounter: Payer: Self-pay | Admitting: Ophthalmology

## 2024-08-20 ENCOUNTER — Other Ambulatory Visit: Payer: Self-pay

## 2024-08-20 ENCOUNTER — Encounter
Admission: RE | Disposition: A | Discharge status: Home / Self Care | Payer: Self-pay | Source: Home / Self Care | Attending: Ophthalmology

## 2024-08-20 DIAGNOSIS — H2511 Age-related nuclear cataract, right eye: Secondary | ICD-10-CM | POA: Insufficient documentation

## 2024-08-20 DIAGNOSIS — Z79899 Other long term (current) drug therapy: Secondary | ICD-10-CM | POA: Diagnosis not present

## 2024-08-20 DIAGNOSIS — E785 Hyperlipidemia, unspecified: Secondary | ICD-10-CM | POA: Insufficient documentation

## 2024-08-20 HISTORY — PX: CATARACT EXTRACTION W/PHACO: SHX586

## 2024-08-20 HISTORY — DX: Dizziness and giddiness: R42

## 2024-08-20 SURGERY — PHACOEMULSIFICATION, CATARACT, WITH IOL INSERTION
Anesthesia: Monitor Anesthesia Care | Laterality: Right

## 2024-08-20 MED ORDER — TETRACAINE HCL 0.5 % OP SOLN
OPHTHALMIC | Status: AC
  Filled 2024-08-20: qty 4

## 2024-08-20 MED ORDER — TETRACAINE HCL 0.5 % OP SOLN
1.0000 [drp] | OPHTHALMIC | Status: DC | PRN
  Administered 2024-08-20 (×3): 1 [drp] via OPHTHALMIC

## 2024-08-20 MED ORDER — PHENYLEPHRINE HCL 10 % OP SOLN
OPHTHALMIC | Status: AC
  Filled 2024-08-20: qty 5

## 2024-08-20 MED ORDER — PHENYLEPHRINE HCL 10 % OP SOLN
1.0000 [drp] | OPHTHALMIC | Status: AC
Start: 2024-08-20 — End: 2024-08-20
  Administered 2024-08-20 (×3): 1 [drp] via OPHTHALMIC

## 2024-08-20 MED ORDER — BRIMONIDINE TARTRATE-TIMOLOL 0.2-0.5 % OP SOLN
OPHTHALMIC | Status: DC | PRN
  Administered 2024-08-20: 1 [drp] via OPHTHALMIC

## 2024-08-20 MED ORDER — SIGHTPATH DOSE#1 NA HYALUR & NA CHOND-NA HYALUR IO KIT
PACK | INTRAOCULAR | Status: DC | PRN
Start: 2024-08-20 — End: 2024-08-20
  Administered 2024-08-20: 1 via OPHTHALMIC

## 2024-08-20 MED ORDER — NEOMYCIN-POLYMYXIN-DEXAMETH 0.1 % OP OINT
TOPICAL_OINTMENT | OPHTHALMIC | Status: DC | PRN
  Administered 2024-08-20: 1 via OPHTHALMIC

## 2024-08-20 MED ORDER — SIGHTPATH DOSE#1 BSS IO SOLN
INTRAOCULAR | Status: DC | PRN
  Administered 2024-08-20: 61 mL via OPHTHALMIC

## 2024-08-20 MED ORDER — FENTANYL CITRATE (PF) 100 MCG/2ML IJ SOLN
INTRAMUSCULAR | Status: AC
  Filled 2024-08-20: qty 2

## 2024-08-20 MED ORDER — SIGHTPATH DOSE#1 BSS IO SOLN
INTRAOCULAR | Status: DC | PRN
Start: 2024-08-20 — End: 2024-08-20
  Administered 2024-08-20: 15 mL via INTRAOCULAR

## 2024-08-20 MED ORDER — LACTATED RINGERS IV SOLN: INTRAVENOUS | Status: DC

## 2024-08-20 MED ORDER — FENTANYL CITRATE (PF) 100 MCG/2ML IJ SOLN
INTRAMUSCULAR | Status: DC | PRN
  Administered 2024-08-20: 50 ug via INTRAVENOUS

## 2024-08-20 MED ORDER — MIDAZOLAM HCL 2 MG/2ML IJ SOLN
INTRAMUSCULAR | Status: AC
  Filled 2024-08-20: qty 2

## 2024-08-20 MED ORDER — LIDOCAINE HCL (PF) 2 % IJ SOLN
INTRAOCULAR | Status: DC | PRN
  Administered 2024-08-20: 2 mL

## 2024-08-20 MED ORDER — CYCLOPENTOLATE HCL 2 % OP SOLN
1.0000 [drp] | OPHTHALMIC | Status: AC
  Administered 2024-08-20 (×3): 1 [drp] via OPHTHALMIC

## 2024-08-20 MED ORDER — CEFUROXIME OPHTHALMIC INJECTION 1 MG/0.1 ML
INJECTION | OPHTHALMIC | Status: DC | PRN
  Administered 2024-08-20: 1 mg via INTRACAMERAL

## 2024-08-20 MED ORDER — MIDAZOLAM HCL (PF) 2 MG/2ML IJ SOLN
INTRAMUSCULAR | Status: DC | PRN
Start: 2024-08-20 — End: 2024-08-20
  Administered 2024-08-20: 1 mg via INTRAVENOUS

## 2024-08-20 MED ORDER — CYCLOPENTOLATE HCL 2 % OP SOLN
OPHTHALMIC | Status: AC
  Filled 2024-08-20: qty 2

## 2024-08-20 SURGICAL SUPPLY — 8 items
FEE CATARACT SUITE SIGHTPATH (MISCELLANEOUS) ×1 IMPLANT
GLOVE BIOGEL PI IND STRL 8 (GLOVE) ×1 IMPLANT
GLOVE SURG LX STRL 7.5 STRW (GLOVE) ×1 IMPLANT
GLOVE SURG SYN 6.5 PF PI BL (GLOVE) ×1 IMPLANT
LENS IOL TECNIS EYHANCE 10.5 (Intraocular Lens) IMPLANT
NDL FILTER BLUNT 18X1 1/2 (NEEDLE) ×1 IMPLANT
NEEDLE FILTER BLUNT 18X1 1/2 (NEEDLE) ×1 IMPLANT
SYR 3ML LL SCALE MARK (SYRINGE) ×1 IMPLANT

## 2024-08-20 NOTE — H&P (Signed)
 [facility]   Primary Care Physician:  Fernande Ophelia JINNY DOUGLAS, MD Ophthalmologist: Dr. Dene Etienne  Pre-Procedure History & Physical: HPI:  Kimberly Kim is a 77 y.o. female here for ophthalmic surgery.   Past Medical History:  Diagnosis Date   Anemia    Arthritis    Dizziness and giddiness    Hyperlipidemia    Postmenopausal bleeding    Vitamin D deficiency     Past Surgical History:  Procedure Laterality Date   CATARACT EXTRACTION W/PHACO Left 08/06/2024   Procedure: PHACOEMULSIFICATION, CATARACT, WITH IOL INSERTION 8.56 00:44.2;  Surgeon: Etienne Dene, MD;  Location: [facility];  Service: Ophthalmology;  Laterality: Left;   COLONOSCOPY     HYSTEROSCOPY WITH D & C N/A 07/27/2022   Procedure: DILATATION AND CURETTAGE /HYSTEROSCOPY;  Surgeon: Verdon Keen, MD;  Location: [facility];  Service: Gynecology;  Laterality: N/A;   SHOULDER ARTHROSCOPY Right     Prior to Admission medications   Medication Sig Start Date End Date Taking? Authorizing Provider  Cholecalciferol (VITAMIN D3) 50 MCG (2000 UT) TABS Take 1 tablet by mouth daily at 6 (six) AM.   Yes [provider]    Allergies as of 07/21/2024   (No Known Allergies)    Family History  Problem Relation Age of Onset   Breast cancer Neg Hx     Social History   Socioeconomic History   Marital status: Married    Spouse name: Elsie   Number of children: 2   Years of education: Not on file   Highest education level: Not on file  Occupational History   Not on file  Tobacco Use   Smoking status: Never   Smokeless tobacco: Never  Vaping Use   Vaping status: Never Used  Substance and Sexual Activity   Alcohol  use: Yes    Comment: occaisionally   Drug use: Never   Sexual activity: Not on file  Other Topics Concern   Not on file  Social History Narrative   Not on file   Social Drivers of Health   Financial Resource Strain: Low Risk  (05/28/2024)   Received from  [facility]   Overall Financial Resource Strain (CARDIA)    Difficulty of Paying Living Expenses: Not hard at all  Food Insecurity: No Food Insecurity (05/28/2024)   Received from [facility]   Hunger Vital Sign    Within the past 12 months, you worried that your food would run out before you got the money to buy more.: Never true    Within the past 12 months, the food you bought just didn't last and you didn't have money to get more.: Never true  Transportation Needs: No Transportation Needs (05/28/2024)   Received [facility] - Transportation    In the past 12 months, has lack of transportation kept you from medical appointments or from getting medications?: No    Lack of Transportation (Non-Medical): No  Physical Activity: Not on file  Stress: Not on file  Social Connections: Not on file  Intimate Partner Violence: Not on file    Review of Systems: See HPI, otherwise negative ROS  Physical Exam: BP (!) 158/64   Pulse 64   Temp 97.8 F (36.6 C) (Temporal)   Resp 16   Ht 5' 2 (1.575 m)   Wt 58.5 kg   SpO2 99%   BMI 23.59 kg/m  General:   Alert,  pleasant and cooperative  in NAD Head:  Normocephalic and atraumatic. Lungs:  Clear to auscultation.    Heart:  Regular rate and rhythm.   Impression/Plan: Kimberly Kim is here for ophthalmic surgery.  Risks, benefits, limitations, and alternatives regarding ophthalmic surgery have been reviewed with the patient.  Questions have been answered.  All parties agreeable.   Kimberly GASKIN, MD  08/20/2024, 8:21 AM

## 2024-08-20 NOTE — Anesthesia Postprocedure Evaluation (Signed)
 Anesthesia Post Note  Patient: Kimberly Kim  Procedure(s) Performed: PHACOEMULSIFICATION, CATARACT, WITH IOL INSERTION 8.16 00:37.8 (Right)  Patient location during evaluation: PACU Anesthesia Type: MAC Level of consciousness: awake and alert Pain management: pain level controlled Vital Signs Assessment: post-procedure vital signs reviewed and stable Respiratory status: spontaneous breathing, nonlabored ventilation, respiratory function stable and patient connected to nasal cannula oxygen Cardiovascular status: stable and blood pressure returned to baseline Postop Assessment: no apparent nausea or vomiting Anesthetic complications: no   No notable events documented.   Last Vitals:  Vitals:   08/20/24 0903 08/20/24 0908  BP: (!) 148/59 (!) 137/45  Pulse: 65 (!) 59  Resp: 13 11  Temp: (!) 36.2 C (!) 36.2 C  SpO2: 98% 99%    Last Pain:  Vitals:   08/20/24 0908  TempSrc:   PainSc: 0-No pain                 Dyani Babel C Jazmin Ley

## 2024-08-20 NOTE — Op Note (Signed)
 LOCATION:  [facility]   PREOPERATIVE DIAGNOSIS:    Nuclear sclerotic cataract right eye. H25.11   POSTOPERATIVE DIAGNOSIS:  Nuclear sclerotic cataract right eye.     PROCEDURE:  Phacoemusification with posterior chamber intraocular lens placement of the right eye   ULTRASOUND TIME: Procedure(s): PHACOEMULSIFICATION, CATARACT, WITH IOL INSERTION 8.16 00:37.8 (Right)  LENS:   Implant Name Type Inv. Item Serial No. Manufacturer Lot No. LRB No. Used Action  LENS IOL TECNIS EYHANCE 10.5 - [id number] Intraocular Lens LENS IOL TECNIS EYHANCE 10.5 [id number] [employer]  Right 1 Implanted         SURGEON:  Dene FABIENE Etienne, MD   ANESTHESIA:  Topical with tetracaine  drops and 2% Xylocaine  jelly, augmented with 1% preservative-free intracameral lidocaine .    COMPLICATIONS:  None.   DESCRIPTION OF PROCEDURE:  The patient was identified in the holding room and transported to the operating room and placed in the supine position under the operating microscope.  The right eye was identified as the operative eye and it was prepped and draped in the usual sterile ophthalmic fashion.   A 1 millimeter clear-corneal paracentesis was made at the 12:00 position.  0.5 ml of preservative-free 1% lidocaine  was injected into the anterior chamber. The anterior chamber was filled with Viscoat viscoelastic.  A 2.4 millimeter keratome was used to make a near-clear corneal incision at the 9:00 position.  A curvilinear capsulorrhexis was made with a cystotome and capsulorrhexis forceps.  Balanced salt solution was used to hydrodissect and hydrodelineate the nucleus.   Phacoemulsification was then used in stop and chop fashion to remove the lens nucleus and epinucleus.  The remaining cortex was then removed using the irrigation and aspiration handpiece. Provisc was then placed into the capsular bag to distend it for lens placement.  A lens was then injected into the capsular bag.  The remaining  viscoelastic was aspirated.   Wounds were hydrated with balanced salt solution.  The anterior chamber was inflated to a physiologic pressure with balanced salt solution.  No wound leaks were noted. Cefuroxime  0.1 ml of a 10mg /ml solution was injected into the anterior chamber for a dose of 1 mg of intracameral antibiotic at the completion of the case.   Timolol  and Brimonidine  drops and Maxitrol  ointment were applied to the eye.  The patient was taken to the recovery room in stable condition without complications of anesthesia or surgery.   Marq Rebello 08/20/2024, 9:00 AM

## 2024-08-20 NOTE — Transfer of Care (Signed)
 Immediate Anesthesia Transfer of Care Note  Patient: Kimberly Kim  Procedure(s) Performed: PHACOEMULSIFICATION, CATARACT, WITH IOL INSERTION 8.16 00:37.8 (Right)  Patient Location: PACU  Anesthesia Type: MAC  Level of Consciousness: awake, alert  and patient cooperative  Airway and Oxygen Therapy: Patient Spontanous Breathing and Patient connected to supplemental oxygen  Post-op Assessment: Post-op Vital signs reviewed, Patient's Cardiovascular Status Stable, Respiratory Function Stable, Patent Airway and No signs of Nausea or vomiting  Post-op Vital Signs: Reviewed and stable  Complications: No notable events documented.

## 2024-09-09 ENCOUNTER — Other Ambulatory Visit: Payer: Self-pay | Admitting: Internal Medicine

## 2024-09-09 DIAGNOSIS — Z1231 Encounter for screening mammogram for malignant neoplasm of breast: Secondary | ICD-10-CM

## 2024-10-09 ENCOUNTER — Ambulatory Visit
Admission: RE | Admit: 2024-10-09 | Discharge: 2024-10-09 | Disposition: A | Discharge status: Home / Self Care | Source: Ambulatory Visit | Attending: Internal Medicine | Admitting: Internal Medicine

## 2024-10-09 DIAGNOSIS — Z1231 Encounter for screening mammogram for malignant neoplasm of breast: Secondary | ICD-10-CM | POA: Insufficient documentation
